# Patient Record
Sex: Female | Born: 1965 | Race: White | Hispanic: No | Marital: Single | Smoking: Never smoker
Health system: Southern US, Community
[De-identification: ages and names within clinical notes are randomized; demographics above are authoritative.]

## PROBLEM LIST (undated history)

## (undated) DIAGNOSIS — M5412 Radiculopathy, cervical region: Secondary | ICD-10-CM

## (undated) DIAGNOSIS — K589 Irritable bowel syndrome without diarrhea: Secondary | ICD-10-CM

## (undated) DIAGNOSIS — Z8489 Family history of other specified conditions: Secondary | ICD-10-CM

## (undated) DIAGNOSIS — F32A Depression, unspecified: Secondary | ICD-10-CM

## (undated) DIAGNOSIS — E669 Obesity, unspecified: Secondary | ICD-10-CM

## (undated) DIAGNOSIS — F319 Bipolar disorder, unspecified: Secondary | ICD-10-CM

## (undated) DIAGNOSIS — M47812 Spondylosis without myelopathy or radiculopathy, cervical region: Secondary | ICD-10-CM

## (undated) DIAGNOSIS — F1411 Cocaine abuse, in remission: Secondary | ICD-10-CM

## (undated) DIAGNOSIS — G8929 Other chronic pain: Secondary | ICD-10-CM

## (undated) DIAGNOSIS — G43909 Migraine, unspecified, not intractable, without status migrainosus: Secondary | ICD-10-CM

## (undated) DIAGNOSIS — T753XXA Motion sickness, initial encounter: Secondary | ICD-10-CM

## (undated) DIAGNOSIS — K219 Gastro-esophageal reflux disease without esophagitis: Secondary | ICD-10-CM

## (undated) DIAGNOSIS — F1010 Alcohol abuse, uncomplicated: Secondary | ICD-10-CM

## (undated) DIAGNOSIS — G4700 Insomnia, unspecified: Secondary | ICD-10-CM

## (undated) DIAGNOSIS — F419 Anxiety disorder, unspecified: Secondary | ICD-10-CM

## (undated) DIAGNOSIS — R0600 Dyspnea, unspecified: Secondary | ICD-10-CM

## (undated) HISTORY — PX: APPENDECTOMY: SHX54

---

## 2002-01-20 ENCOUNTER — Other Ambulatory Visit: Admission: RE | Admit: 2002-01-20 | Discharge: 2002-01-20 | Payer: Self-pay | Admitting: Family Medicine

## 2005-08-22 ENCOUNTER — Ambulatory Visit: Payer: Self-pay | Admitting: Internal Medicine

## 2007-08-31 ENCOUNTER — Emergency Department: Payer: Self-pay | Admitting: Emergency Medicine

## 2007-10-30 ENCOUNTER — Emergency Department: Payer: Self-pay | Admitting: Emergency Medicine

## 2008-02-11 ENCOUNTER — Ambulatory Visit: Payer: Self-pay | Admitting: Unknown Physician Specialty

## 2008-02-19 ENCOUNTER — Ambulatory Visit: Payer: Self-pay | Admitting: Unknown Physician Specialty

## 2009-05-11 ENCOUNTER — Emergency Department: Payer: Self-pay | Admitting: Emergency Medicine

## 2011-03-04 ENCOUNTER — Emergency Department: Payer: Self-pay | Admitting: Unknown Physician Specialty

## 2013-04-23 ENCOUNTER — Emergency Department: Payer: Self-pay | Admitting: Unknown Physician Specialty

## 2013-04-23 LAB — URINALYSIS, COMPLETE
Bilirubin,UR: NEGATIVE
Ph: 5 (ref 4.5–8.0)
Protein: NEGATIVE
Specific Gravity: 1.009 (ref 1.003–1.030)

## 2013-04-23 LAB — COMPREHENSIVE METABOLIC PANEL
Bilirubin,Total: 0.8 mg/dL (ref 0.2–1.0)
Calcium, Total: 9.6 mg/dL (ref 8.5–10.1)
Co2: 23 mmol/L (ref 21–32)
Creatinine: 1.28 mg/dL (ref 0.60–1.30)
Glucose: 136 mg/dL — ABNORMAL HIGH (ref 65–99)
Osmolality: 270 (ref 275–301)
Potassium: 3.6 mmol/L (ref 3.5–5.1)
SGOT(AST): 23 U/L (ref 15–37)
SGPT (ALT): 27 U/L (ref 12–78)
Sodium: 135 mmol/L — ABNORMAL LOW (ref 136–145)
Total Protein: 8.2 g/dL (ref 6.4–8.2)

## 2013-04-23 LAB — DRUG SCREEN, URINE
Barbiturates, Ur Screen: NEGATIVE (ref ?–200)
Cocaine Metabolite,Ur ~~LOC~~: POSITIVE (ref ?–300)
Opiate, Ur Screen: NEGATIVE (ref ?–300)
Phencyclidine (PCP) Ur S: NEGATIVE (ref ?–25)
Tricyclic, Ur Screen: NEGATIVE (ref ?–1000)

## 2013-04-23 LAB — ETHANOL: Ethanol: <3 mg/dL

## 2013-04-23 LAB — CBC
HGB: 16 g/dL (ref 12.0–16.0)
MCH: 32.1 pg (ref 26.0–34.0)
Platelet: 374 10*3/uL (ref 150–440)
RDW: 13.2 % (ref 11.5–14.5)

## 2013-05-31 ENCOUNTER — Encounter (HOSPITAL_BASED_OUTPATIENT_CLINIC_OR_DEPARTMENT_OTHER): Payer: Self-pay | Admitting: *Deleted

## 2013-05-31 ENCOUNTER — Emergency Department (HOSPITAL_BASED_OUTPATIENT_CLINIC_OR_DEPARTMENT_OTHER)
Admission: EM | Admit: 2013-05-31 | Discharge: 2013-05-31 | Disposition: A | Discharge status: Home / Self Care | Payer: Self-pay | Attending: Emergency Medicine | Admitting: Emergency Medicine

## 2013-05-31 DIAGNOSIS — F17200 Nicotine dependence, unspecified, uncomplicated: Secondary | ICD-10-CM | POA: Insufficient documentation

## 2013-05-31 DIAGNOSIS — R0989 Other specified symptoms and signs involving the circulatory and respiratory systems: Secondary | ICD-10-CM | POA: Insufficient documentation

## 2013-05-31 DIAGNOSIS — F329 Major depressive disorder, single episode, unspecified: Secondary | ICD-10-CM

## 2013-05-31 DIAGNOSIS — Z79899 Other long term (current) drug therapy: Secondary | ICD-10-CM | POA: Insufficient documentation

## 2013-05-31 DIAGNOSIS — F3130 Bipolar disorder, current episode depressed, mild or moderate severity, unspecified: Secondary | ICD-10-CM | POA: Insufficient documentation

## 2013-05-31 DIAGNOSIS — R0609 Other forms of dyspnea: Secondary | ICD-10-CM | POA: Insufficient documentation

## 2013-05-31 DIAGNOSIS — IMO0002 Reserved for concepts with insufficient information to code with codable children: Secondary | ICD-10-CM | POA: Insufficient documentation

## 2013-05-31 DIAGNOSIS — G4700 Insomnia, unspecified: Secondary | ICD-10-CM | POA: Insufficient documentation

## 2013-05-31 HISTORY — DX: Insomnia, unspecified: G47.00

## 2013-05-31 HISTORY — DX: Bipolar disorder, unspecified: F31.9

## 2013-05-31 MED ORDER — TRAZODONE HCL 100 MG PO TABS: 100.0000 mg | ORAL_TABLET | Freq: Every day | ORAL | Status: DC

## 2013-05-31 NOTE — ED Notes (Signed)
Pt reports uncontrollable crying, has not slept in two days, feels "more depressed", headaches, and nausea over the past 5 days. Pt reports recent addition of abilify medication.

## 2013-05-31 NOTE — ED Provider Notes (Signed)
  CSN: [id number]     Arrival date & time 05/31/13  1535 History     None    Chief Complaint  Patient presents with  . Depression   (Consider location/radiation/quality/duration/timing/severity/associated sxs/prior Treatment) Patient is a 47 y.o. female presenting with mental health disorder. The history is provided by the patient. No language interpreter was used.  Mental Health Problem Presenting symptoms: agitation and depression   Degree of incapacity (severity):  Unable to specify Duration:  4 days Timing:  Constant Progression:  Worsening Relieved by:  Nothing Worsened by:  Alcohol Ineffective treatments:  None tried Associated symptoms: insomnia   Pt reports she is at [facility] for alcohol abuse.  Pt reports she was started on abilify 4 days ago.   Pt reports increased depressson  Since starting abilify.   Pt reports not sleeping  Past Medical History  Diagnosis Date  . Bipolar 1 disorder, depressed   . Insomnia    History reviewed. No pertinent past surgical history. No family history on file. History  Substance Use Topics  . Smoking status: Current Every Day Smoker  . Smokeless tobacco: Not on file  . Alcohol Use: Yes   OB History   Grav Para Term Preterm Abortions TAB SAB Ect Mult Living                 Review of Systems  Psychiatric/Behavioral: Positive for agitation. The patient has insomnia.   All other systems reviewed and are negative.    Allergies  Review of patient's allergies indicates no known allergies.  Home Medications   Current Outpatient Rx  Name  Route  Sig  Dispense  Refill  . ARIPiprazole (ABILIFY) 5 MG tablet   Oral   Take 5 mg by mouth daily.         Marland Kitchen buPROPion (WELLBUTRIN SR) 150 MG 12 hr tablet   Oral   Take 150 mg by mouth 2 (two) times daily.         . traZODone (DESYREL) 50 MG tablet   Oral   Take 50 mg by mouth at bedtime.          BP 114/95  Pulse 83  Temp(Src) 98.6 F (37 C) (Oral)  Resp 18  Ht 5\' 6"   (1.676 m)  Wt 176 lb (79.833 kg)  BMI 28.42 kg/m2  SpO2 100% Physical Exam  Nursing note and vitals reviewed. Constitutional: She is oriented to person, place, and time. She appears well-developed and well-nourished.  HENT:  Head: Normocephalic.  Right Ear: External ear normal.  Left Ear: External ear normal.  Eyes: EOM are normal. Pupils are equal, round, and reactive to light.  Neck: Normal range of motion.  Cardiovascular: Normal rate and regular rhythm.   Pulmonary/Chest: She is in respiratory distress.  Abdominal: Soft. She exhibits no distension.  Musculoskeletal: Normal range of motion.  Neurological: She is alert and oriented to person, place, and time.  Skin: Skin is warm.  Psychiatric: She has a normal mood and affect.    ED Course   Procedures (including critical care time)  Labs Reviewed - No data to display No results found. 1. Depression     MDM  Stop wellbitrin,   Increase trazadone to 200mg  at bedtime.   Stop abilify  Elson Areas, [place] 05/31/13 1648

## 2013-05-31 NOTE — ED Notes (Signed)
Called [facility] and updated on patient.  [facility] on the way to pick up patient.

## 2013-06-01 NOTE — ED Provider Notes (Signed)
Medical screening examination/treatment/procedure(s) were performed by non-physician practitioner and as supervising physician I was immediately available for consultation/collaboration.  Gerhard Munch, MD 06/01/13 [phone]

## 2013-06-05 ENCOUNTER — Emergency Department (HOSPITAL_BASED_OUTPATIENT_CLINIC_OR_DEPARTMENT_OTHER)
Admission: EM | Admit: 2013-06-05 | Discharge: 2013-06-05 | Disposition: A | Discharge status: Rehabilitation Facility | Payer: Self-pay | Attending: Emergency Medicine | Admitting: Emergency Medicine

## 2013-06-05 ENCOUNTER — Encounter (HOSPITAL_BASED_OUTPATIENT_CLINIC_OR_DEPARTMENT_OTHER): Payer: Self-pay

## 2013-06-05 ENCOUNTER — Emergency Department (HOSPITAL_BASED_OUTPATIENT_CLINIC_OR_DEPARTMENT_OTHER): Payer: Self-pay

## 2013-06-05 DIAGNOSIS — R110 Nausea: Secondary | ICD-10-CM | POA: Insufficient documentation

## 2013-06-05 DIAGNOSIS — F17200 Nicotine dependence, unspecified, uncomplicated: Secondary | ICD-10-CM | POA: Insufficient documentation

## 2013-06-05 DIAGNOSIS — Z79899 Other long term (current) drug therapy: Secondary | ICD-10-CM | POA: Insufficient documentation

## 2013-06-05 DIAGNOSIS — K5900 Constipation, unspecified: Secondary | ICD-10-CM | POA: Insufficient documentation

## 2013-06-05 DIAGNOSIS — F3130 Bipolar disorder, current episode depressed, mild or moderate severity, unspecified: Secondary | ICD-10-CM | POA: Insufficient documentation

## 2013-06-05 DIAGNOSIS — G4700 Insomnia, unspecified: Secondary | ICD-10-CM | POA: Insufficient documentation

## 2013-06-05 LAB — CBC WITH DIFFERENTIAL/PLATELET
Eosinophils Absolute: 0.2 10*3/uL (ref 0.0–0.7)
Eosinophils Relative: 3 % (ref 0–5)
HCT: 38.8 % (ref 36.0–46.0)
Hemoglobin: 13.5 g/dL (ref 12.0–15.0)
Lymphs Abs: 2.1 10*3/uL (ref 0.7–4.0)
MCH: 31.6 pg (ref 26.0–34.0)
MCV: 90.9 fL (ref 78.0–100.0)
Monocytes Absolute: 0.6 10*3/uL (ref 0.1–1.0)
Monocytes Relative: 8 % (ref 3–12)
RBC: 4.27 MIL/uL (ref 3.87–5.11)

## 2013-06-05 LAB — COMPREHENSIVE METABOLIC PANEL
ALT: 16 U/L (ref 0–35)
Alkaline Phosphatase: 49 U/L (ref 39–117)
BUN: 13 mg/dL (ref 6–23)
CO2: 28 mEq/L (ref 19–32)
Calcium: 9.6 mg/dL (ref 8.4–10.5)
GFR calc Af Amer: 86 mL/min — ABNORMAL LOW (ref >90)
GFR calc non Af Amer: 74 mL/min — ABNORMAL LOW (ref >90)
Glucose, Bld: 116 mg/dL — ABNORMAL HIGH (ref 70–99)
Sodium: 138 mEq/L (ref 135–145)

## 2013-06-05 LAB — URINALYSIS, ROUTINE W REFLEX MICROSCOPIC
Bilirubin Urine: NEGATIVE
Glucose, UA: NEGATIVE mg/dL
Hgb urine dipstick: NEGATIVE
Ketones, ur: NEGATIVE mg/dL
Protein, ur: NEGATIVE mg/dL

## 2013-06-05 MED ORDER — SODIUM CHLORIDE 0.9 % IV BOLUS (SEPSIS)
1000.0000 mL | Freq: Once | INTRAVENOUS | Status: AC
  Administered 2013-06-05: 1000 mL via INTRAVENOUS

## 2013-06-05 MED ORDER — ONDANSETRON HCL 4 MG/2ML IJ SOLN
4.0000 mg | Freq: Once | INTRAMUSCULAR | Status: AC
  Administered 2013-06-05: 4 mg via INTRAVENOUS
  Filled 2013-06-05: qty 2

## 2013-06-05 MED ORDER — ONDANSETRON HCL 4 MG PO TABS: 4.0000 mg | ORAL_TABLET | Freq: Three times a day (TID) | ORAL | Status: DC | PRN

## 2013-06-05 MED ORDER — POLYETHYLENE GLYCOL 3350 17 GM/SCOOP PO POWD: 17.0000 g | Freq: Every day | ORAL | Status: DC

## 2013-06-05 NOTE — ED Notes (Signed)
Pt returned from xray

## 2013-06-05 NOTE — ED Notes (Signed)
Patient transported to X-ray 

## 2013-06-05 NOTE — ED Provider Notes (Signed)
CSN: [id number]     Arrival date & time 06/05/13  0715 History     First MD Initiated Contact with Patient 06/05/13 [phone]     Chief Complaint  Patient presents with  . Abdominal Pain   (Consider location/radiation/quality/duration/timing/severity/associated sxs/prior Treatment) Patient is a 47 y.o. female presenting with abdominal pain.  Abdominal Pain  Pt is a resident at [facility] for approx 25 days treatment for EtOH abuse. Recently seen in the ED 4 days ago for depression and insomnia after starting Abilify. She was advised to increase Trazadone and stop Abilify. Since then she reports persistent nausea, poor PO intake no BM and 'tight' abdominal discomfort in epigastric area. No dysuria no vomiting.  Past Medical History  Diagnosis Date  . Bipolar 1 disorder, depressed   . Insomnia    No past surgical history on file. No family history on file. History  Substance Use Topics  . Smoking status: Current Every Day Smoker  . Smokeless tobacco: Not on file  . Alcohol Use: Yes   OB History   Grav Para Term Preterm Abortions TAB SAB Ect Mult Living                 Review of Systems  Gastrointestinal: Positive for abdominal pain.   All other systems reviewed and are negative except as noted in HPI.   Allergies  Review of patient's allergies indicates no known allergies.  Home Medications   Current Outpatient Rx  Name  Route  Sig  Dispense  Refill  . ARIPiprazole (ABILIFY) 5 MG tablet   Oral   Take 5 mg by mouth daily.         Marland Kitchen buPROPion (WELLBUTRIN SR) 150 MG 12 hr tablet   Oral   Take 150 mg by mouth 2 (two) times daily.         . traZODone (DESYREL) 100 MG tablet   Oral   Take 1 tablet (100 mg total) by mouth at bedtime.   30 tablet   1   . traZODone (DESYREL) 50 MG tablet   Oral   Take 50 mg by mouth at bedtime.          BP 106/72  Pulse 72  Temp(Src) 97.8 F (36.6 C) (Oral)  Resp 18  SpO2 100%  LMP 05/05/2013 Physical Exam  Nursing note and  vitals reviewed. Constitutional: She is oriented to person, place, and time. She appears well-developed and well-nourished.  HENT:  Head: Normocephalic and atraumatic.  Eyes: EOM are normal. Pupils are equal, round, and reactive to light.  Neck: Normal range of motion. Neck supple.  Cardiovascular: Normal rate, normal heart sounds and intact distal pulses.   Pulmonary/Chest: Effort normal and breath sounds normal. She has no wheezes. She has no rales.  Abdominal: Bowel sounds are normal. She exhibits no distension. There is no tenderness. There is no rebound and no guarding.  Musculoskeletal: Normal range of motion. She exhibits no edema and no tenderness.  Neurological: She is alert and oriented to person, place, and time. She has normal strength. No cranial nerve deficit or sensory deficit.  Skin: Skin is warm and dry. No rash noted.  Psychiatric: She has a normal mood and affect.    ED Course   Procedures (including critical care time)  Labs Reviewed  URINALYSIS, ROUTINE W REFLEX MICROSCOPIC - Abnormal; Notable for the following:    APPearance CLOUDY (*)    All other components within normal limits  COMPREHENSIVE METABOLIC PANEL - Abnormal; Notable  for the following:    Glucose, Bld 116 (*)    GFR calc non Af Amer 74 (*)    GFR calc Af Amer 86 (*)    All other components within normal limits  CBC WITH DIFFERENTIAL  LIPASE, BLOOD   Dg Abd Acute W/chest  06/05/2013   *RADIOLOGY REPORT*  Clinical Data: Nausea.  Abdominal pain.  ACUTE ABDOMEN SERIES (ABDOMEN 2 VIEW & CHEST 1 VIEW)  Comparison: None.  Findings: Lungs appear clear.  Cardiopericardial silhouette within normal limits.  No free air underneath the hemidiaphragms.  Bowel gas pattern is within normal limits.  No dilated loops of large or small bowel.  Stool and bowel gas are present in the rectosigmoid. Bones appear within normal limits.  IMPRESSION: No acute abnormality.  Normal acute abdominal series.   Original Report  Authenticated By: Andreas Newport, M.D.   No diagnosis found.  MDM  Labs and imaging results reviewed. Pt feeling better. Advised to discuss psychotropic medication changes with the provider who is prescribing them. Advised zofran for nausea, stool softeners for constipation.   Lilianne Delair B. Bernette Mayers, MD 06/05/13 1610

## 2013-06-05 NOTE — ED Notes (Signed)
C/o abdominal pain and tightness in her upper abdomen.  Pt states that this started around a week ago at the time that she started Wellbutrin and her Trazodone dose changed.  She has not had a BM in the 4-5 days, usually has one every other day, and has felt nauseous.  Pt denies vomiting or diarrhea.  Pt in NAD, AAOx4.

## 2013-07-03 ENCOUNTER — Emergency Department: Payer: Self-pay | Admitting: Emergency Medicine

## 2014-05-21 ENCOUNTER — Emergency Department: Payer: Self-pay | Admitting: Emergency Medicine

## 2014-11-30 ENCOUNTER — Emergency Department: Payer: Self-pay | Admitting: Emergency Medicine

## 2015-01-31 ENCOUNTER — Emergency Department
Admit: 2015-01-31 | Disposition: A | Discharge status: Home / Self Care | Payer: Self-pay | Admitting: Emergency Medicine

## 2015-06-08 ENCOUNTER — Encounter: Payer: Self-pay | Admitting: Emergency Medicine

## 2015-06-08 ENCOUNTER — Emergency Department
Admission: EM | Admit: 2015-06-08 | Discharge: 2015-06-08 | Disposition: A | Discharge status: Home / Self Care | Payer: Self-pay | Attending: Emergency Medicine | Admitting: Emergency Medicine

## 2015-06-08 DIAGNOSIS — K029 Dental caries, unspecified: Secondary | ICD-10-CM | POA: Insufficient documentation

## 2015-06-08 DIAGNOSIS — Z79899 Other long term (current) drug therapy: Secondary | ICD-10-CM | POA: Insufficient documentation

## 2015-06-08 MED ORDER — KETOROLAC TROMETHAMINE 10 MG PO TABS
10.0000 mg | ORAL_TABLET | Freq: Once | ORAL | Status: AC
  Administered 2015-06-08: 10 mg via ORAL
  Filled 2015-06-08: qty 1

## 2015-06-08 MED ORDER — KETOROLAC TROMETHAMINE 10 MG PO TABS: 10.0000 mg | ORAL_TABLET | Freq: Three times a day (TID) | ORAL | Status: DC | PRN

## 2015-06-08 MED ORDER — AMOXICILLIN 500 MG PO CAPS
500.0000 mg | ORAL_CAPSULE | Freq: Once | ORAL | Status: AC
  Administered 2015-06-08: 500 mg via ORAL
  Filled 2015-06-08: qty 1

## 2015-06-08 MED ORDER — LIDOCAINE VISCOUS 2 % MT SOLN
15.0000 mL | Freq: Once | OROMUCOSAL | Status: AC
  Administered 2015-06-08: 15 mL via OROMUCOSAL
  Filled 2015-06-08: qty 15

## 2015-06-08 MED ORDER — AMOXICILLIN 500 MG PO CAPS: 500.0000 mg | ORAL_CAPSULE | Freq: Two times a day (BID) | ORAL | Status: AC

## 2015-06-08 NOTE — ED Notes (Signed)
Patient ambulatory to triage with steady gait, without difficulty or distress noted; pt reports right lower dental pain

## 2015-06-08 NOTE — Discharge Instructions (Signed)
Dental Caries °Dental caries (also called tooth decay) is the most common oral disease. It can occur at any age but is more common in children and young adults.  °HOW DENTAL CARIES DEVELOPS  °The process of decay begins when bacteria and foods (particularly sugars and starches) combine in your mouth to produce plaque. Plaque is a substance that sticks to the hard, outer surface of a tooth (enamel). The bacteria in plaque produce acids that attack enamel. These acids may also attack the root surface of a tooth (cementum) if it is exposed. Repeated attacks dissolve these surfaces and create holes in the tooth (cavities). If left untreated, the acids destroy the other layers of the tooth.  °RISK FACTORS °· Frequent sipping of sugary beverages.   °· Frequent snacking on sugary and starchy foods, especially those that easily get stuck in the teeth.   °· Poor oral hygiene.   °· Dry mouth.   °· Substance abuse such as methamphetamine abuse.   °· Broken or poor-fitting dental restorations.   °· Eating disorders.   °· Gastroesophageal reflux disease (GERD).   °· Certain radiation treatments to the head and neck. °SYMPTOMS °In the early stages of dental caries, symptoms are seldom present. Sometimes white, chalky areas may be seen on the enamel or other tooth layers. In later stages, symptoms may include: °· Pits and holes on the enamel. °· Toothache after sweet, hot, or cold foods or drinks are consumed. °· Pain around the tooth. °· Swelling around the tooth. °DIAGNOSIS  °Most of the time, dental caries is detected during a regular dental checkup. A diagnosis is made after a thorough medical and dental history is taken and the surfaces of your teeth are checked for signs of dental caries. Sometimes special instruments, such as lasers, are used to check for dental caries. Dental X-ray exams may be taken so that areas not visible to the eye (such as between the contact areas of the teeth) can be checked for cavities.    °TREATMENT  °If dental caries is in its early stages, it may be reversed with a fluoride treatment or an application of a remineralizing agent at the dental office. Thorough brushing and flossing at home is needed to aid these treatments. If it is in its later stages, treatment depends on the location and extent of tooth destruction:  °· If a small area of the tooth has been destroyed, the destroyed area will be removed and cavities will be filled with a material such as gold, silver amalgam, or composite resin.   °· If a large area of the tooth has been destroyed, the destroyed area will be removed and a cap (crown) will be fitted over the remaining tooth structure.   °· If the center part of the tooth (pulp) is affected, a procedure called a root canal will be needed before a filling or crown can be placed.   °· If most of the tooth has been destroyed, the tooth may need to be pulled (extracted). °HOME CARE INSTRUCTIONS °You can prevent, stop, or reverse dental caries at home by practicing good oral hygiene. Good oral hygiene includes: °· Thoroughly cleaning your teeth at least twice a day with a toothbrush and dental floss.   °· Using a fluoride toothpaste. A fluoride mouth rinse may also be used if recommended by your dentist or health care provider.   °· Restricting the amount of sugary and starchy foods and sugary liquids you consume.   °· Avoiding frequent snacking on these foods and sipping of these liquids.   °· Keeping regular visits with   the amount of sugary and starchy foods and sugary liquids you consume.    Avoiding frequent snacking on these foods and sipping of these liquids.    Keeping regular visits with a dentist for checkups and cleanings.  PREVENTION    Practice good oral hygiene.   Consider a dental sealant. A dental sealant is a coating material that is applied by your dentist to the pits and grooves of teeth. The sealant prevents food from being trapped in them. It may protect the teeth for several years.   Ask about fluoride supplements if you live in a community without fluorinated water or with water that has a low fluoride content. Use fluoride supplements  as directed by your dentist or health care provider.   Allow fluoride varnish applications to teeth if directed by your dentist or health care provider.  Document Released: 07/07/2002 Document Revised: 03/01/2014 Document Reviewed: 10/17/2012  Exit[employer] Patient Information 2015 [employer]. This information is not intended to replace advice given to you by your health care provider. Make sure you discuss any questions you have with your health care provider.

## 2015-06-08 NOTE — ED Provider Notes (Signed)
[facility] Emergency Department Provider Note  ____________________________________________  Time seen: 6:30 AM  I have reviewed the triage vital signs and the nursing notes.   HISTORY  Chief Complaint Dental Pain      HPI Sheyla Shannell Mikkelsen is a 49 y.o. female presents with right mandible molar pain 2 days. Patient denies any fever no difficulty swallowing no neck pain or swelling.     Past Medical History  Diagnosis Date  . Bipolar 1 disorder, depressed   . Insomnia     There are no active problems to display for this patient.  Past surgical history None  Current Outpatient Rx  Name  Route  Sig  Dispense  Refill  . ARIPiprazole (ABILIFY) 5 MG tablet   Oral   Take 5 mg by mouth daily.         Marland Kitchen buPROPion (WELLBUTRIN SR) 150 MG 12 hr tablet   Oral   Take 150 mg by mouth 2 (two) times daily.         . ondansetron (ZOFRAN) 4 MG tablet   Oral   Take 1 tablet (4 mg total) by mouth every 8 (eight) hours as needed for nausea.   20 tablet   0   . polyethylene glycol powder (GLYCOLAX/MIRALAX) powder   Oral   Take 17 g by mouth daily.   255 g   0   . traZODone (DESYREL) 100 MG tablet   Oral   Take 1 tablet (100 mg total) by mouth at bedtime.   30 tablet   1   . traZODone (DESYREL) 50 MG tablet   Oral   Take 50 mg by mouth at bedtime.           Allergies Review of patient's allergies indicates no known allergies.  No family history on file.  Social History Social History  Substance Use Topics  . Smoking status: Never Smoker   . Smokeless tobacco: None  . Alcohol Use: No    Review of Systems  Constitutional: Negative for fever. Eyes: Negative for visual changes. ENT: Negative for sore throat. Positive for dental pain Cardiovascular: Negative for chest pain. Respiratory: Negative for shortness of breath. Gastrointestinal: Negative for abdominal pain, vomiting and diarrhea. Genitourinary: Negative for  dysuria. Musculoskeletal: Negative for back pain. Skin: Negative for rash. Neurological: Negative for headaches, focal weakness or numbness.   10-point ROS otherwise negative.  ____________________________________________   PHYSICAL EXAM:  VITAL SIGNS: ED Triage Vitals  Enc Vitals Group     BP 06/08/15 0612 158/93 mmHg     Pulse Rate 06/08/15 0612 81     Resp 06/08/15 0612 18     Temp 06/08/15 0612 97.9 F (36.6 C)     Temp Source 06/08/15 0612 Oral     SpO2 06/08/15 0612 98 %     Weight 06/08/15 0612 205 lb (92.987 kg)     Height 06/08/15 0612 5\' 6"  (1.676 m)     Head Cir --      Peak Flow --      Pain Score 06/08/15 0613 10     Pain Loc --      Pain Edu? --      Excl. in [place]? --      Constitutional: Alert and oriented. Well appearing and in no distress. Eyes: Conjunctivae are normal. PERRL. Normal extraocular movements. ENT   Head: Normocephalic and atraumatic.   Nose: No congestion/rhinnorhea.   Mouth/Throat: Mucous membranes are moist. Right mandible molar dental  carry noted with surrounding gum inflammation.   Neck: No stridor. Cardiovascular: Normal rate, regular rhythm. Normal and symmetric distal pulses are present in all extremities. No murmurs, rubs, or gallops. Respiratory: Normal respiratory effort without tachypnea nor retractions. Breath sounds are clear and equal bilaterally. No wheezes/rales/rhonchi. Gastrointestinal: Soft and nontender. No distention. There is no CVA tenderness. Genitourinary: deferred Musculoskeletal: Nontender with normal range of motion in all extremities. No joint effusions.  No lower extremity tenderness nor edema. Neurologic:  Normal speech and language. No gross focal neurologic deficits are appreciated. Speech is normal.  Skin:  Skin is warm, dry and intact. No rash noted. Psychiatric: Mood and affect are normal. Speech and behavior are normal. Patient exhibits appropriate insight and judgment.      INITIAL  IMPRESSION / ASSESSMENT AND PLAN / ED COURSE  Pertinent labs & imaging results that were available during my care of the patient were reviewed by me and considered in my medical decision making (see chart for details).    ____________________________________________   FINAL CLINICAL IMPRESSION(S) / ED DIAGNOSES  Final diagnoses:  Dental caries      Gregor Hams, MD 06/08/15 [phone]

## 2016-01-19 ENCOUNTER — Encounter: Payer: Self-pay | Admitting: Emergency Medicine

## 2016-01-19 ENCOUNTER — Emergency Department: Payer: Self-pay

## 2016-01-19 ENCOUNTER — Emergency Department
Admission: EM | Admit: 2016-01-19 | Discharge: 2016-01-19 | Disposition: A | Discharge status: Home / Self Care | Payer: Self-pay | Attending: Emergency Medicine | Admitting: Emergency Medicine

## 2016-01-19 DIAGNOSIS — N939 Abnormal uterine and vaginal bleeding, unspecified: Secondary | ICD-10-CM

## 2016-01-19 DIAGNOSIS — R102 Pelvic and perineal pain: Secondary | ICD-10-CM

## 2016-01-19 DIAGNOSIS — D259 Leiomyoma of uterus, unspecified: Secondary | ICD-10-CM | POA: Insufficient documentation

## 2016-01-19 DIAGNOSIS — Z3202 Encounter for pregnancy test, result negative: Secondary | ICD-10-CM | POA: Insufficient documentation

## 2016-01-19 LAB — CBC
HEMATOCRIT: 37.9 % (ref 35.0–47.0)
Hemoglobin: 13 g/dL (ref 12.0–16.0)
MCH: 30 pg (ref 26.0–34.0)
MCHC: 34.2 g/dL (ref 32.0–36.0)
MCV: 87.7 fL (ref 80.0–100.0)
Platelets: 303 10*3/uL (ref 150–440)
RBC: 4.32 MIL/uL (ref 3.80–5.20)
RDW: 14.2 % (ref 11.5–14.5)
WBC: 5.9 10*3/uL (ref 3.6–11.0)

## 2016-01-19 LAB — URINALYSIS COMPLETE WITH MICROSCOPIC (ARMC ONLY)
BACTERIA UA: NONE SEEN
Bilirubin Urine: NEGATIVE
GLUCOSE, UA: NEGATIVE mg/dL
Ketones, ur: NEGATIVE mg/dL
Leukocytes, UA: NEGATIVE
Nitrite: NEGATIVE
PROTEIN: NEGATIVE mg/dL
Specific Gravity, Urine: 1.023 (ref 1.005–1.030)
pH: 5 (ref 5.0–8.0)

## 2016-01-19 LAB — BASIC METABOLIC PANEL
Anion gap: 3 — ABNORMAL LOW (ref 5–15)
BUN: 9 mg/dL (ref 6–20)
CALCIUM: 8.5 mg/dL — AB (ref 8.9–10.3)
CHLORIDE: 104 mmol/L (ref 101–111)
CO2: 29 mmol/L (ref 22–32)
CREATININE: 0.81 mg/dL (ref 0.44–1.00)
GFR calc non Af Amer: >60 mL/min (ref >60)
GLUCOSE: 95 mg/dL (ref 65–99)
Potassium: 3.3 mmol/L — ABNORMAL LOW (ref 3.5–5.1)
Sodium: 136 mmol/L (ref 135–145)

## 2016-01-19 LAB — POCT PREGNANCY, URINE: PREG TEST UR: NEGATIVE

## 2016-01-19 MED ORDER — OXYCODONE-ACETAMINOPHEN 5-325 MG PO TABS: 2.0000 | ORAL_TABLET | Freq: Four times a day (QID) | ORAL | Status: DC | PRN

## 2016-01-19 MED ORDER — OXYCODONE-ACETAMINOPHEN 5-325 MG PO TABS: 1.0000 | ORAL_TABLET | Freq: Once | ORAL | Status: AC

## 2016-01-19 MED ORDER — OXYCODONE-ACETAMINOPHEN 5-325 MG PO TABS
1.0000 | ORAL_TABLET | Freq: Once | ORAL | Status: AC
  Administered 2016-01-19: 1 via ORAL

## 2016-01-19 MED ORDER — OXYCODONE-ACETAMINOPHEN 5-325 MG PO TABS
ORAL_TABLET | ORAL | Status: AC
  Administered 2016-01-19: 1 via ORAL
  Filled 2016-01-19: qty 1

## 2016-01-19 MED ORDER — OXYCODONE-ACETAMINOPHEN 5-325 MG PO TABS
1.0000 | ORAL_TABLET | Freq: Once | ORAL | Status: AC
  Administered 2016-01-19: 1 via ORAL
  Filled 2016-01-19: qty 1

## 2016-01-19 MED ORDER — OXYCODONE-ACETAMINOPHEN 5-325 MG PO TABS
ORAL_TABLET | ORAL | Status: AC
  Filled 2016-01-19: qty 1

## 2016-01-19 MED ORDER — IBUPROFEN 800 MG PO TABS: 800.0000 mg | ORAL_TABLET | Freq: Three times a day (TID) | ORAL | Status: DC | PRN

## 2016-01-19 NOTE — ED Notes (Signed)
Patient to ED today with vaginal bleeding and lower abdominal cramping that has been severe since January.  Pt has history of vaginal bleeding and also bladder sling.  Pt also states she is now incontinent of urine again, despite sling.

## 2016-01-19 NOTE — ED Notes (Addendum)
Patient ambulatory to triage with steady gait, without difficulty or distress noted; pt reports vag bleeding for last few months; seen by doctor and dx possible fibroid tumors; sched 4/10 for biopsy & u/s but having increased bleeding and lower abd cramping

## 2016-01-19 NOTE — ED Provider Notes (Signed)
[facility] Emergency Department Provider Note     Time seen: ----------------------------------------- 7:31 AM on 01/19/2016 -----------------------------------------    I have reviewed the triage vital signs and the nursing notes.   HISTORY  Chief Complaint Vaginal Bleeding    HPI Stephanie Holland is a 50 y.o. female who presents ER for vaginal bleeding for last several months. Patient states she did not have a period for 6 months and then began having heavy bleeding in January and then again over the past several months. She was seen by her doctor and diagnosed with fibroids, has scheduled biopsy on April 10 but was having increasing bleeding and lower abdominal cramping. She denies fevers chills or other complaints. Nothing makes her symptoms better.   Past Medical History  Diagnosis Date  . Bipolar 1 disorder, depressed ([place])   . Insomnia     There are no active problems to display for this patient.   Past Surgical History  Procedure Laterality Date  . Appendectomy      Allergies Review of patient's allergies indicates no known allergies.  Social History Social History  Substance Use Topics  . Smoking status: Never Smoker   . Smokeless tobacco: None  . Alcohol Use: No    Review of Systems Constitutional: Negative for fever. Eyes: Negative for visual changes. ENT: Negative for sore throat. Cardiovascular: Negative for chest pain. Respiratory: Negative for shortness of breath. Gastrointestinal: Positive for abdominal pain Genitourinary: Negative for dysuria. Positive for vaginal bleeding Musculoskeletal: Negative for back pain. Skin: Negative for rash. Neurological: Negative for headaches, focal weakness or numbness.  10-point ROS otherwise negative.  ____________________________________________   PHYSICAL EXAM:  VITAL SIGNS: ED Triage Vitals  Enc Vitals Group     BP 01/19/16 0319 149/86 mmHg     Pulse Rate 01/19/16 0319  76     Resp 01/19/16 0319 18     Temp 01/19/16 0319 98 F (36.7 C)     Temp Source 01/19/16 0319 Oral     SpO2 01/19/16 0319 98 %     Weight 01/19/16 0319 198 lb (89.812 kg)     Height 01/19/16 0319 5\' 7"  (1.702 m)     Head Cir --      Peak Flow --      Pain Score 01/19/16 0317 10     Pain Loc --      Pain Edu? --      Excl. in [place]? --     Constitutional: Alert and oriented. Well appearing and in no distress. Eyes: Conjunctivae are normal. PERRL. Normal extraocular movements. ENT   Head: Normocephalic and atraumatic.   Nose: No congestion/rhinnorhea.   Mouth/Throat: Mucous membranes are moist.   Neck: No stridor. Cardiovascular: Normal rate, regular rhythm. Normal and symmetric distal pulses are present in all extremities. No murmurs, rubs, or gallops. Respiratory: Normal respiratory effort without tachypnea nor retractions. Breath sounds are clear and equal bilaterally. No wheezes/rales/rhonchi. Gastrointestinal: Lower abdominal tenderness, no rebound or guarding. Normal bowel sounds. Musculoskeletal: Nontender with normal range of motion in all extremities. No joint effusions.  No lower extremity tenderness nor edema. Neurologic:  Normal speech and language. No gross focal neurologic deficits are appreciated. Speech is normal. No gait instability. Skin:  Skin is warm, dry and intact. No rash noted. Psychiatric: Mood and affect are normal. Speech and behavior are normal. Patient exhibits appropriate insight and judgment. ____________________________________________  ED COURSE:  Pertinent labs & imaging results that were available during my care of  the patient were reviewed by me and considered in my medical decision making (see chart for details). Patient is no acute distress, will check basic labs and likely ultrasound. ____________________________________________    LABS (pertinent positives/negatives)  Labs Reviewed  BASIC METABOLIC PANEL - Abnormal; Notable  for the following:    Potassium 3.3 (*)    Calcium 8.5 (*)    Anion gap 3 (*)    All other components within normal limits  URINALYSIS COMPLETEWITH MICROSCOPIC ([facility] ONLY) - Abnormal; Notable for the following:    Color, Urine YELLOW (*)    APPearance CLEAR (*)    Hgb urine dipstick 3+ (*)    Squamous Epithelial / LPF 0-5 (*)    All other components within normal limits  CBC  POCT PREGNANCY, URINE    RADIOLOGY US  IMPRESSION: Multiple fibroids in the uterus, including a posterior submucosal fibroid. Mild thickening in the endometrium with endometrial calcifications. No endometrial fluid. Ovaries are normal without evidence of abnormal adnexal mass or torsion.   ____________________________________________  FINAL ASSESSMENT AND PLAN  Fibroid uterus, abnormal vaginal bleeding  Plan: Patient with labs and imaging as dictated above. Patient does have outpatient follow-up scheduled. She is in no acute distress, feeling better with Percocet. Patient does not want to try hormone therapy at this time due to the risk associated. I will advise her to continue follow-up with her doctor.   Earleen Newport, MD   Earleen Newport, MD 01/19/16 [phone]

## 2016-01-19 NOTE — ED Notes (Signed)
St pain has decreased to 4/10

## 2016-01-19 NOTE — ED Notes (Signed)
Pt to triage, updated on wait time; st pain has returned 10/10; med admin for comfort

## 2016-01-19 NOTE — Discharge Instructions (Signed)
Abnormal Uterine Bleeding °Abnormal uterine bleeding can affect women at various stages in life, including teenagers, women in their reproductive years, pregnant women, and women who have reached menopause. Several kinds of uterine bleeding are considered abnormal, including: °· Bleeding or spotting between periods.   °· Bleeding after sexual intercourse.   °· Bleeding that is heavier or more than normal.   °· Periods that last longer than usual. °· Bleeding after menopause.   °Many cases of abnormal uterine bleeding are minor and simple to treat, while others are more serious. Any type of abnormal bleeding should be evaluated by your health care provider. Treatment will depend on the cause of the bleeding. °HOME CARE INSTRUCTIONS °Monitor your condition for any changes. The following actions may help to alleviate any discomfort you are experiencing: °· Avoid the use of tampons and douches as directed by your health care provider. °· Change your pads frequently. °You should get regular pelvic exams and Pap tests. Keep all follow-up appointments for diagnostic tests as directed by your health care provider.  °SEEK MEDICAL CARE IF:  °· Your bleeding lasts more than 1 week.   °· You feel dizzy at times.   °SEEK IMMEDIATE MEDICAL CARE IF:  °· You pass out.   °· You are changing pads every 15 to 30 minutes.   °· You have abdominal pain. °· You have a fever.   °· You become sweaty or weak.   °· You are passing large blood clots from the vagina.   °· You start to feel nauseous and vomit. °MAKE SURE YOU:  °· Understand these instructions. °· Will watch your condition. °· Will get help right away if you are not doing well or get worse. °  °This information is not intended to replace advice given to you by your health care provider. Make sure you discuss any questions you have with your health care provider. °  °Document Released: 10/15/2005 Document Revised: 10/20/2013 Document Reviewed: 05/14/2013 °Elsevier Interactive  Patient Education ©2016 Else[employer] Inc[employer] ° ° ° °Uterine Fibroids °Uterine fibroids are tissue masses (tumors) that can develop in the womb (uterus). They are also called leiomyomas. This type of tumor is not cancerous (benign) and does not spread to other parts of the body outside of the pelvic area, which is between the hip bones. Occasionally, fibroids may develop in the fallopian tubes, in the cervix, or on the support structures (ligaments) that surround the uterus. °You can have one or many fibroids. Fibroids can vary in size, weight, and where they grow in the uterus. Some can become quite large. Most fibroids do not require medical treatment. °CAUSES °A fibroid can develop when a single uterine cell keeps growing (replicating). Most cells in the human body have a control mechanism that keeps them from replicating without control. °SIGNS AND SYMPTOMS °Symptoms may include:  °· Heavy bleeding during your period. °· Bleeding or spotting between periods. °· Pelvic pain and pressure. °· Bladder problems, such as needing to urinate more often (urinary frequency) or urgently. °· Inability to reproduce offspring (infertility). °· Miscarriages. °DIAGNOSIS °Uterine fibroids are diagnosed through a physical exam. Your health care provider may feel the lumpy tumors during a pelvic exam. Ultrasonography and an MRI may be done to determine the size, location, and number of fibroids. °TREATMENT °Treatment may include: °· Watchful waiting. This involves getting the fibroid checked by your health care provider to see if it grows or shrinks. Follow your health care provider's recommendations for how often to have this checked. °· Hormone medicines. These can be taken by   mouth or given through an intrauterine device (IUD). °· Surgery. °¨ Removing the fibroids (myomectomy) or the uterus (hysterectomy). °¨ Removing blood supply to the fibroids (uterine artery embolization). °If fibroids interfere with your fertility and you want to  become pregnant, your health care provider may recommend having the fibroids removed.  °HOME CARE INSTRUCTIONS °· Keep all follow-up visits as directed by your health care provider. This is important. °· Take medicines only as directed by your health care provider. °¨ If you were prescribed a hormone treatment, take the hormone medicines exactly as directed. °¨ Do not take aspirin, because it can cause bleeding. °· Ask your health care provider about taking iron pills and increasing the amount of dark green, leafy vegetables in your diet. These actions can help to boost your blood iron levels, which may be affected by heavy menstrual bleeding. °· Pay close attention to your period and tell your health care provider about any changes, such as: °¨ Increased blood flow that requires you to use more pads or tampons than usual per month. °¨ A change in the number of days that your period lasts per month. °¨ A change in symptoms that are associated with your period, such as abdominal cramping or back pain. °SEEK MEDICAL CARE IF: °· You have pelvic pain, back pain, or abdominal cramps that cannot be controlled with medicines. °· You have an increase in bleeding between and during periods. °· You soak tampons or pads in a half hour or less. °· You feel lightheaded, extra tired, or weak. °SEEK IMMEDIATE MEDICAL CARE IF: °· You faint. °· You have a sudden increase in pelvic pain. °  °This information is not intended to replace advice given to you by your health care provider. Make sure you discuss any questions you have with your health care provider. °  °Document Released: 10/12/2000 Document Revised: 11/05/2014 Document Reviewed: 04/13/2014 °Elsevier Interactive Patient Education ©2016 Elsevier Inc. ° °

## 2016-04-01 ENCOUNTER — Encounter: Payer: Self-pay | Admitting: Medical Oncology

## 2016-04-01 ENCOUNTER — Emergency Department
Admission: EM | Admit: 2016-04-01 | Discharge: 2016-04-01 | Disposition: A | Discharge status: Home / Self Care | Payer: Self-pay | Attending: Emergency Medicine | Admitting: Emergency Medicine

## 2016-04-01 ENCOUNTER — Emergency Department: Payer: Self-pay

## 2016-04-01 DIAGNOSIS — Y999 Unspecified external cause status: Secondary | ICD-10-CM | POA: Insufficient documentation

## 2016-04-01 DIAGNOSIS — S20211A Contusion of right front wall of thorax, initial encounter: Secondary | ICD-10-CM | POA: Insufficient documentation

## 2016-04-01 DIAGNOSIS — F319 Bipolar disorder, unspecified: Secondary | ICD-10-CM | POA: Insufficient documentation

## 2016-04-01 DIAGNOSIS — Z79899 Other long term (current) drug therapy: Secondary | ICD-10-CM | POA: Insufficient documentation

## 2016-04-01 DIAGNOSIS — Y9372 Activity, wrestling: Secondary | ICD-10-CM | POA: Insufficient documentation

## 2016-04-01 DIAGNOSIS — X501XXA Overexertion from prolonged static or awkward postures, initial encounter: Secondary | ICD-10-CM | POA: Insufficient documentation

## 2016-04-01 DIAGNOSIS — Y929 Unspecified place or not applicable: Secondary | ICD-10-CM | POA: Insufficient documentation

## 2016-04-01 MED ORDER — KETOROLAC TROMETHAMINE 30 MG/ML IJ SOLN
30.0000 mg | Freq: Once | INTRAMUSCULAR | Status: AC
  Administered 2016-04-01: 30 mg via INTRAMUSCULAR
  Filled 2016-04-01: qty 1

## 2016-04-01 MED ORDER — NAPROXEN 500 MG PO TABS: 500.0000 mg | ORAL_TABLET | Freq: Two times a day (BID) | ORAL | Status: DC

## 2016-04-01 NOTE — Discharge Instructions (Signed)
Chest Contusion A chest contusion is a deep bruise on your chest area. Contusions are the result of an injury that caused bleeding under the skin. A chest contusion may involve bruising of the skin, muscles, or ribs. The contusion may turn blue, purple, or yellow. Minor injuries will give you a painless contusion, but more severe contusions may stay painful and swollen for a few weeks. CAUSES  A contusion is usually caused by a blow, trauma, or direct force to an area of the body. SYMPTOMS   Swelling and redness of the injured area.  Discoloration of the injured area.  Tenderness and soreness of the injured area.  Pain. DIAGNOSIS  The diagnosis can be made by taking a history and performing a physical exam. An X-ray, CT scan, or MRI may be needed to determine if there were any associated injuries, such as broken bones (fractures) or internal injuries. TREATMENT  Often, the best treatment for a chest contusion is resting, icing, and applying cold compresses to the injured area. Deep breathing exercises may be recommended to reduce the risk of pneumonia. Over-the-counter medicines may also be recommended for pain control. HOME CARE INSTRUCTIONS   Put ice on the injured area.  Put ice in a plastic bag.  Place a towel between your skin and the bag.  Leave the ice on for 15-20 minutes, 03-04 times a day.  Only take over-the-counter or prescription medicines as directed by your caregiver. Your caregiver may recommend avoiding anti-inflammatory medicines (aspirin, ibuprofen, and naproxen) for 48 hours because these medicines may increase bruising.  Rest the injured area.  Perform deep-breathing exercises as directed by your caregiver.  Stop smoking if you smoke.  Do not lift objects over 5 pounds (2.3 kg) for 3 days or longer if recommended by your caregiver. SEEK IMMEDIATE MEDICAL CARE IF:   You have increased bruising or swelling.  You have pain that is getting worse.  You have  difficulty breathing.  You have dizziness, weakness, or fainting.  You have blood in your urine or stool.  You cough up or vomit blood.  Your swelling or pain is not relieved with medicines. MAKE SURE YOU:   Understand these instructions.  Will watch your condition.  Will get help right away if you are not doing well or get worse.   This information is not intended to replace advice given to you by your health care provider. Make sure you discuss any questions you have with your health care provider.   Document Released: 07/10/2001 Document Revised: 07/09/2012 Document Reviewed: 04/07/2012 Elsevier Interactive Patient Education 2016 [place].  Cryotherapy Cryotherapy means treatment with cold. Ice or gel packs can be used to reduce both pain and swelling. Ice is the most helpful within the first 24 to 48 hours after an injury or flare-up from overusing a muscle or joint. Sprains, strains, spasms, burning pain, shooting pain, and aches can all be eased with ice. Ice can also be used when recovering from surgery. Ice is effective, has very few side effects, and is safe for most people to use. PRECAUTIONS  Ice is not a safe treatment option for people with:  Raynaud phenomenon. This is a condition affecting small blood vessels in the extremities. Exposure to cold may cause your problems to return.  Cold hypersensitivity. There are many forms of cold hypersensitivity, including:  Cold urticaria. Red, itchy hives appear on the skin when the tissues begin to warm after being iced.  Cold erythema. This is a red,  itchy rash caused by exposure to cold.  Cold hemoglobinuria. Red blood cells break down when the tissues begin to warm after being iced. The hemoglobin that carry oxygen are passed into the urine because they cannot combine with blood proteins fast enough.  Numbness or altered sensitivity in the area being iced. If you have any of the following conditions, do not use ice  until you have discussed cryotherapy with your caregiver:  Heart conditions, such as arrhythmia, angina, or chronic heart disease.  High blood pressure.  Healing wounds or open skin in the area being iced.  Current infections.  Rheumatoid arthritis.  Poor circulation.  Diabetes. Ice slows the blood flow in the region it is applied. This is beneficial when trying to stop inflamed tissues from spreading irritating chemicals to surrounding tissues. However, if you expose your skin to cold temperatures for too long or without the proper protection, you can damage your skin or nerves. Watch for signs of skin damage due to cold. HOME CARE INSTRUCTIONS Follow these tips to use ice and cold packs safely.  Place a dry or damp towel between the ice and skin. A damp towel will cool the skin more quickly, so you may need to shorten the time that the ice is used.  For a more rapid response, add gentle compression to the ice.  Ice for no more than 10 to 20 minutes at a time. The bonier the area you are icing, the less time it will take to get the benefits of ice.  Check your skin after 5 minutes to make sure there are no signs of a poor response to cold or skin damage.  Rest 20 minutes or more between uses.  Once your skin is numb, you can end your treatment. You can test numbness by very lightly touching your skin. The touch should be so light that you do not see the skin dimple from the pressure of your fingertip. When using ice, most people will feel these normal sensations in this order: cold, burning, aching, and numbness.  Do not use ice on someone who cannot communicate their responses to pain, such as small children or people with dementia. HOW TO MAKE AN ICE PACK Ice packs are the most common way to use ice therapy. Other methods include ice massage, ice baths, and cryosprays. Muscle creams that cause a cold, tingly feeling do not offer the same benefits that ice offers and should not be  used as a substitute unless recommended by your caregiver. To make an ice pack, do one of the following:  Place crushed ice or a bag of frozen vegetables in a sealable plastic bag. Squeeze out the excess air. Place this bag inside another plastic bag. Slide the bag into a pillowcase or place a damp towel between your skin and the bag.  Mix 3 parts water with 1 part rubbing alcohol. Freeze the mixture in a sealable plastic bag. When you remove the mixture from the freezer, it will be slushy. Squeeze out the excess air. Place this bag inside another plastic bag. Slide the bag into a pillowcase or place a damp towel between your skin and the bag. SEEK MEDICAL CARE IF:  You develop white spots on your skin. This may give the skin a blotchy (mottled) appearance.  Your skin turns blue or pale.  Your skin becomes waxy or hard.  Your swelling gets worse. MAKE SURE YOU:   Understand these instructions.  Will watch your condition.  Will get  help right away if you are not doing well or get worse.   This information is not intended to replace advice given to you by your health care provider. Make sure you discuss any questions you have with your health care provider.   Document Released: 06/11/2011 Document Revised: 11/05/2014 Document Reviewed: 06/11/2011 Elsevier Interactive Patient Education [employer].

## 2016-04-01 NOTE — ED Notes (Signed)
states she was playing around with her b/f  Twisted the wrong way  Having pain to right lateral rib and back area  Increased pain with inspiration and movement

## 2016-04-01 NOTE — ED Notes (Signed)
Pt reports she was "wrestling around" last night when she began to have pain to the rt rib cage and around to her back. Pt ambulatory.

## 2016-04-01 NOTE — ED Provider Notes (Signed)
[facility] Emergency Department Provider Note  ____________________________________________  Time seen: Approximately 10:57 AM  I have reviewed the triage vital signs and the nursing notes.   HISTORY  Chief Complaint Back Pain and rib pain     HPI Stephanie Holland is a 50 y.o. female , NAD, presents to the emergency department with two-day history of right lower rib pain. States she was "wrestling" with her boyfriend was taken her last night. States that she somehow moved her lower body to the left lumbar of a party moved to the right and felt a pain in her lower ribs. Has been taking ibuprofen over the last day without any significant relief of her pain. States it hurts about the right lower rib cage with deep inspiration, when she coughs or if she sneezes. Has not noted any redness, swelling, open wounds, skin sores no bruising about the area. Has not had any blunt trauma to the area in question. Denies chest pain or shortness of breath. No numbness, weakness, tingling. Denies any lower back pain.   Past Medical History  Diagnosis Date  . Bipolar 1 disorder, depressed ([place])   . Insomnia     There are no active problems to display for this patient.   Past Surgical History  Procedure Laterality Date  . Appendectomy      Current Outpatient Rx  Name  Route  Sig  Dispense  Refill  . buPROPion (WELLBUTRIN SR) 150 MG 12 hr tablet   Oral   Take 150 mg by mouth 2 (two) times daily.         . naproxen (NAPROSYN) 500 MG tablet   Oral   Take 1 tablet (500 mg total) by mouth 2 (two) times daily with a meal.   30 tablet   0   . traZODone (DESYREL) 100 MG tablet   Oral   Take 1 tablet (100 mg total) by mouth at bedtime.   30 tablet   1   . traZODone (DESYREL) 50 MG tablet   Oral   Take 50 mg by mouth at bedtime.           Allergies Review of patient's allergies indicates no known allergies.  No family history on file.  Social History Social  History  Substance Use Topics  . Smoking status: Never Smoker   . Smokeless tobacco: None  . Alcohol Use: No     Review of Systems  Constitutional: No fever/chills, fatigue Eyes: No visual changes.  Cardiovascular: No chest pain. Respiratory: No shortness of breath. No wheezing.  Gastrointestinal: No abdominal pain.  No nausea, vomiting.   Musculoskeletal: Pain about the right lower rib cage. Negative for back, neck pain.  Skin: Negative for rash, lacerations, bruising. Neurological: Negative for headaches, focal weakness or numbness. No tingling 10-point ROS otherwise negative.  ____________________________________________   PHYSICAL EXAM:  VITAL SIGNS: ED Triage Vitals  Enc Vitals Group     BP 04/01/16 1030 155/102 mmHg     Pulse Rate 04/01/16 1030 74     Resp 04/01/16 1030 18     Temp 04/01/16 1030 98.8 F (37.1 C)     Temp Source 04/01/16 1030 Oral     SpO2 04/01/16 1030 99 %     Weight 04/01/16 1030 180 lb (81.647 kg)     Height 04/01/16 1030 5\' 7"  (1.702 m)     Head Cir --      Peak Flow --      Pain Score  04/01/16 1031 9     Pain Loc --      Pain Edu? --      Excl. in [place]? --      Constitutional: Alert and oriented. Well appearing and in no acute distress, but in pain with movement. Eyes: Conjunctivae are normal.  Head: Atraumatic. Neck: Supple with full range of motion. Hematological/Lymphatic/Immunilogical: No cervical lymphadenopathy. Cardiovascular: Normal rate, regular rhythm. Grossly normal heart sounds. Good peripheral circulation with 2+ pulses in the right upper extremity. Respiratory: Normal respiratory effort without tachypnea or retractions. Lungs CTAB with breath sounds noted in all lung fields. Gastrointestinal: Soft and nontender without distention, guarding in all quadrants.  Musculoskeletal: No redness to palpation about the right, anterior, lower rib cage without crepitus or bony deformity to palpation.  Neurologic:  Normal speech and  language. No gross focal neurologic deficits are appreciated. Sensation to light touch grossly intact about the right anterior lower rib cage. Skin:  Skin is warm, dry and intact. No rash, bruising, open wounds, skin source, lacerations noted. Psychiatric: Mood and affect are normal. Speech and behavior are normal. Patient exhibits appropriate insight and judgement.   ____________________________________________   LABS  None ____________________________________________  EKG  None ____________________________________________  RADIOLOGY I have personally viewed and evaluated these images (plain radiographs) as part of my medical decision making, as well as reviewing the written report by the radiologist.  Dg Ribs Unilateral W/chest Right  04/01/2016  CLINICAL DATA:  Wrestling injury EXAM: RIGHT RIBS AND CHEST - 3+ VIEW COMPARISON:  06/05/2013 FINDINGS: No fracture or other bone lesions are seen involving the ribs. There is no evidence of pneumothorax or pleural effusion. Both lungs are clear. Heart size and mediastinal contours are within normal limits. IMPRESSION: Negative. Electronically Signed   By: Kerby Moors M.D.   On: 04/01/2016 11:21    ____________________________________________    PROCEDURES  Procedure(s) performed: None   Medications  ketorolac (TORADOL) 30 MG/ML injection 30 mg (30 mg Intramuscular Given 04/01/16 1202)     ____________________________________________   INITIAL IMPRESSION / ASSESSMENT AND PLAN / ED COURSE  Pertinent imaging results that were available during my care of the patient were reviewed by me and considered in my medical decision making (see chart for details).  Patient's diagnosis is consistent with right chest wall contusion. Patient will be discharged home with prescriptions for naproxen to take as directed. Patient advised to apply ice to the effected area 20 minutes 3-4 times daily to decrease pain and swelling. Patient also use a  pillow if she needs to cough or sneeze and hold to the affected area to decrease pain. Patient was given a work note to excuse from work today and tomorrow due to injury. Patient is to follow up with her primary care provider or [facility] if symptoms persist past this treatment course. Patient is given ED precautions to return to the ED for any worsening or new symptoms.    ____________________________________________  FINAL CLINICAL IMPRESSION(S) / ED DIAGNOSES  Final diagnoses:  Chest wall contusion, right, initial encounter      NEW MEDICATIONS STARTED DURING THIS VISIT:  Discharge Medication List as of 04/01/2016 11:39 AM    START taking these medications   Details  naproxen (NAPROSYN) 500 MG tablet Take 1 tablet (500 mg total) by mouth 2 (two) times daily with a meal., Starting 04/01/2016, Until Discontinued, Print         Jami L Hagler, PA-C 04/01/16 1147   ----------------------------------------- 11:57 AM  on 04/01/2016 -----------------------------------------  Patient was initially offered Toradol injection during my initial assessment but declined. At the time of discharge patient now wants the Toradol injection. I have ordered Toradol 30 mg IM and we will monitor the patient for tolerance.  Patient noted decrease in pain after Toradol 30mg  Im was given. She was discharged without being in any acute distress, ambulating without assistance.  Braxton Feathers, PA-C 04/01/16 1326  Orbie Pyo, MD 04/01/16 [phone]

## 2016-04-24 ENCOUNTER — Emergency Department
Admission: EM | Admit: 2016-04-24 | Discharge: 2016-04-24 | Disposition: A | Discharge status: Home / Self Care | Payer: Self-pay | Attending: Emergency Medicine | Admitting: Emergency Medicine

## 2016-04-24 DIAGNOSIS — J019 Acute sinusitis, unspecified: Secondary | ICD-10-CM | POA: Insufficient documentation

## 2016-04-24 DIAGNOSIS — T700XXA Otitic barotrauma, initial encounter: Secondary | ICD-10-CM | POA: Insufficient documentation

## 2016-04-24 DIAGNOSIS — F319 Bipolar disorder, unspecified: Secondary | ICD-10-CM | POA: Insufficient documentation

## 2016-04-24 LAB — POCT RAPID STREP A: STREPTOCOCCUS, GROUP A SCREEN (DIRECT): NEGATIVE

## 2016-04-24 MED ORDER — METHYLPREDNISOLONE 4 MG PO TBPK: ORAL_TABLET | ORAL | Status: DC

## 2016-04-24 MED ORDER — MAGIC MOUTHWASH
10.0000 mL | Freq: Once | ORAL | Status: AC
  Administered 2016-04-24: 10 mL via ORAL
  Filled 2016-04-24: qty 10

## 2016-04-24 MED ORDER — MAGIC MOUTHWASH: 5.0000 mL | Freq: Three times a day (TID) | ORAL | Status: DC | PRN

## 2016-04-24 MED ORDER — AMOXICILLIN-POT CLAVULANATE 875-125 MG PO TABS
1.0000 | ORAL_TABLET | Freq: Once | ORAL | Status: AC
  Administered 2016-04-24: 1 via ORAL
  Filled 2016-04-24: qty 1

## 2016-04-24 MED ORDER — AMOXICILLIN-POT CLAVULANATE 875-125 MG PO TABS: 1.0000 | ORAL_TABLET | Freq: Two times a day (BID) | ORAL | Status: DC

## 2016-04-24 NOTE — Discharge Instructions (Signed)
1. Take antibiotic as prescribed (Augmentin 875 mg twice daily 7 days). 2. You may use Magic mouthwash as needed for throat pain. 3. Return to the ER for worsening symptoms, persistent vomiting, difficulty breathing or other concerns.  Sinusitis, Adult Sinusitis is redness, soreness, and puffiness (inflammation) of the air pockets in the bones of your face (sinuses). The redness, soreness, and puffiness can cause air and mucus to get trapped in your sinuses. This can allow germs to grow and cause an infection.  HOME CARE   Drink enough fluids to keep your pee (urine) clear or pale yellow.  Use a humidifier in your home.  Run a hot shower to create steam in the bathroom. Sit in the bathroom with the door closed. Breathe in the steam 3-4 times a day.  Put a warm, moist washcloth on your face 3-4 times a day, or as told by your doctor.  Use salt water sprays (saline sprays) to wet the thick fluid in your nose. This can help the sinuses drain.  Only take medicine as told by your doctor. GET HELP RIGHT AWAY IF:   Your pain gets worse.  You have very bad headaches.  You are sick to your stomach (nauseous).  You throw up (vomit).  You are very sleepy (drowsy) all the time.  Your face is puffy (swollen).  Your vision changes.  You have a stiff neck.  You have trouble breathing. MAKE SURE YOU:   Understand these instructions.  Will watch your condition.  Will get help right away if you are not doing well or get worse.   This information is not intended to replace advice given to you by your health care provider. Make sure you discuss any questions you have with your health care provider.   Document Released: 04/02/2008 Document Revised: 11/05/2014 Document Reviewed: 05/20/2012 Elsevier Interactive Patient Education 2016 [place] media is inflammation of your middle ear. This occurs when the auditory tube (eustachian tube) leading from the  back of your nose (nasopharynx) to your eardrum is blocked. This blockage may result from a cold, environmental allergies, or an upper respiratory infection. Unresolved barotitis media may lead to damage or hearing loss (barotrauma), which may become permanent. HOME CARE INSTRUCTIONS   Use medicines as recommended by your health care provider. Over-the-counter medicines will help unblock the canal and can help during times of air travel.  Do not put anything into your ears to clean or unplug them. Eardrops will not be helpful.  Do not swim, dive, or fly until your health care provider says it is all right to do so. If these activities are necessary, chewing gum with frequent, forceful swallowing may help. It is also helpful to hold your nose and gently blow to pop your ears for equalizing pressure changes. This forces air into the eustachian tube.  Only take over-the-counter or prescription medicines for pain, discomfort, or fever as directed by your health care provider.  A decongestant may be helpful in decongesting the middle ear and make pressure equalization easier. SEEK MEDICAL CARE IF:  You experience a serious form of dizziness in which you feel as if the room is spinning and you feel nauseated (vertigo).  Your symptoms only involve one ear. SEEK IMMEDIATE MEDICAL CARE IF:   You develop a severe headache, dizziness, or severe ear pain.  You have bloody or pus-like drainage from your ears.  You develop a fever.  Your problems do not improve or become worse.  MAKE SURE YOU:   Understand these instructions.  Will watch your condition.  Will get help right away if you are not doing well or get worse.   This information is not intended to replace advice given to you by your health care provider. Make sure you discuss any questions you have with your health care provider.   Document Released: 10/12/2000 Document Revised: 08/05/2013 Document Reviewed: 05/12/2013 Elsevier  Interactive Patient Education [employer].

## 2016-04-24 NOTE — ED Notes (Signed)
Pt in with nasal congestion, facial pressure, bilat ear fullness. Now also co voice being hoarse and has been taking otc allergy without relief.

## 2016-04-24 NOTE — ED Provider Notes (Signed)
[facility] Emergency Department Provider Note   ____________________________________________  Time seen: Approximately 4:20 AM  I have reviewed the triage vital signs and the nursing notes.   HISTORY  Chief Complaint Nasal Congestion    HPI Stephanie Holland is a 50 y.o. female who presents to the ED from home with a chief complaint of nasal congestion, facial pressure, bilateral ear fullness, laryngitis and nonproductive cough. Patient reports symptoms intermittently for the past 2 weeks, worsening for the past day. Describes green discharge from nose and hoarseness. Denies associated fever, chills, chest pain, shortness of breath, abdominal pain, nausea, vomiting, diarrhea. Denies recent travel or trauma. Patient has tried OTC allergy medicines without relief of symptoms.   Past Medical History  Diagnosis Date  . Bipolar 1 disorder, depressed ([place])   . Insomnia     There are no active problems to display for this patient.   Past Surgical History  Procedure Laterality Date  . Appendectomy      Current Outpatient Rx  Name  Route  Sig  Dispense  Refill  . amoxicillin-clavulanate (AUGMENTIN) 875-125 MG tablet   Oral   Take 1 tablet by mouth 2 (two) times daily.   14 tablet   0   . buPROPion (WELLBUTRIN SR) 150 MG 12 hr tablet   Oral   Take 150 mg by mouth 2 (two) times daily.         . magic mouthwash SOLN   Oral   Take 5 mLs by mouth 3 (three) times daily as needed for mouth pain.   75 mL   0   . methylPREDNISolone (MEDROL DOSEPAK) 4 MG TBPK tablet      Take as directed   21 tablet   0   . naproxen (NAPROSYN) 500 MG tablet   Oral   Take 1 tablet (500 mg total) by mouth 2 (two) times daily with a meal.   30 tablet   0   . traZODone (DESYREL) 100 MG tablet   Oral   Take 1 tablet (100 mg total) by mouth at bedtime.   30 tablet   1   . traZODone (DESYREL) 50 MG tablet   Oral   Take 50 mg by mouth at bedtime.            Allergies Review of patient's allergies indicates no known allergies.  No family history on file.  Social History Social History  Substance Use Topics  . Smoking status: Never Smoker   . Smokeless tobacco: Not on file  . Alcohol Use: No    Review of Systems  Constitutional: No fever/chills. Eyes: No visual changes. ENT: Positive for sinus pressure, ear fullness and sore throat. Cardiovascular: Denies chest pain. Respiratory: Positive for nonproductive cough. Denies shortness of breath. Gastrointestinal: No abdominal pain.  No nausea, no vomiting.  No diarrhea.  No constipation. Genitourinary: Negative for dysuria. Musculoskeletal: Negative for back pain. Skin: Negative for rash. Neurological: Negative for headaches, focal weakness or numbness.  10-point ROS otherwise negative.  ____________________________________________   PHYSICAL EXAM:  VITAL SIGNS: ED Triage Vitals  Enc Vitals Group     BP 04/24/16 0212 136/89 mmHg     Pulse Rate 04/24/16 0212 95     Resp 04/24/16 0212 18     Temp 04/24/16 0212 98.9 F (37.2 C)     Temp Source 04/24/16 0212 Oral     SpO2 04/24/16 0212 97 %     Weight 04/24/16 0212 180 lb (81.647  kg)     Height 04/24/16 0212 5\' 7"  (1.702 m)     Head Cir --      Peak Flow --      Pain Score 04/24/16 0213 10     Pain Loc --      Pain Edu? --      Excl. in [place]? --     Constitutional: Alert and oriented. Well appearing and in no acute distress. Eyes: Conjunctivae are normal. PERRL. EOMI. Head: Atraumatic. Frontal maxillary sinuses tender to palpation. Ears: Bilateral TMs with mild fluid. Nose: Congestion/rhinnorhea. Mouth/Throat: Mucous membranes are moist.  Oropharynx mildly erythematous without tonsillar swelling, exudates or peritonsillar. Mildly hoarse voice. There is no muffled voice or drooling. Neck: No stridor.  Supple neck without meningismus. Hematological/Lymphatic/Immunilogical: [place] anterior cervical  lymphadenopathy. Cardiovascular: Normal rate, regular rhythm. Grossly normal heart sounds.  Good peripheral circulation. Respiratory: Hacking dry cough. Normal respiratory effort.  No retractions. Lungs CTAB. Gastrointestinal: Soft and nontender. No distention. No abdominal bruits. No CVA tenderness. Musculoskeletal: No lower extremity tenderness nor edema.  No joint effusions. Neurologic:  Normal speech and language. No gross focal neurologic deficits are appreciated. No gait instability. Skin:  Skin is warm, dry and intact. No rash noted. No petechiae. Psychiatric: Mood and affect are normal. Speech and behavior are normal.  ____________________________________________   LABS (all labs ordered are listed, but only abnormal results are displayed)  Labs Reviewed  POCT RAPID STREP A   ____________________________________________  EKG  None ____________________________________________  RADIOLOGY  None ____________________________________________   PROCEDURES  Procedure(s) performed: None  Critical Care performed: No  ____________________________________________   INITIAL IMPRESSION / ASSESSMENT AND PLAN / ED COURSE  Pertinent labs & imaging results that were available during my care of the patient were reviewed by me and considered in my medical decision making (see chart for details).  50 year old female who presents with sinusitis and barotitis media. Will place on Medrol Dosepak, Augmentin, Magic mouthwash and follow-up with her PCP. Strict return precautions given. Patient verbalizes understanding and agrees with plan of care. ____________________________________________   FINAL CLINICAL IMPRESSION(S) / ED DIAGNOSES  Final diagnoses:  Acute sinusitis, recurrence not specified, unspecified location  Barotitis media, initial encounter      NEW MEDICATIONS STARTED DURING THIS VISIT:  New Prescriptions   AMOXICILLIN-CLAVULANATE (AUGMENTIN) 875-125 MG TABLET     Take 1 tablet by mouth 2 (two) times daily.   MAGIC MOUTHWASH SOLN    Take 5 mLs by mouth 3 (three) times daily as needed for mouth pain.   METHYLPREDNISOLONE (MEDROL DOSEPAK) 4 MG TBPK TABLET    Take as directed     Note:  This document was prepared using Dragon voice recognition software and may include unintentional dictation errors.    Paulette Blanch, MD 04/24/16 [phone]

## 2016-04-26 LAB — CULTURE, GROUP A STREP (THRC)

## 2016-05-14 ENCOUNTER — Ambulatory Visit: Payer: Self-pay | Admitting: Urology

## 2016-05-14 ENCOUNTER — Encounter: Payer: Self-pay | Admitting: Urology

## 2016-08-06 ENCOUNTER — Encounter: Payer: Self-pay | Admitting: Emergency Medicine

## 2016-08-06 ENCOUNTER — Emergency Department
Admission: EM | Admit: 2016-08-06 | Discharge: 2016-08-06 | Disposition: A | Discharge status: Home / Self Care | Payer: Self-pay | Attending: Emergency Medicine | Admitting: Emergency Medicine

## 2016-08-06 DIAGNOSIS — G5601 Carpal tunnel syndrome, right upper limb: Secondary | ICD-10-CM | POA: Insufficient documentation

## 2016-08-06 DIAGNOSIS — Z79899 Other long term (current) drug therapy: Secondary | ICD-10-CM | POA: Insufficient documentation

## 2016-08-06 DIAGNOSIS — Z791 Long term (current) use of non-steroidal anti-inflammatories (NSAID): Secondary | ICD-10-CM | POA: Insufficient documentation

## 2016-08-06 MED ORDER — MELOXICAM 15 MG PO TABS: 15.0000 mg | ORAL_TABLET | Freq: Every day | ORAL | 0 refills | Status: DC

## 2016-08-06 NOTE — ED Notes (Signed)
See triage note  Pain with some numbness to right hand for several months  Denies any injury but does a lot of repetitve movement at work  Positive pulses  Grips equal

## 2016-08-06 NOTE — ED Triage Notes (Signed)
Pt presents to ED with reports of right hand pain that radiates to arm, elbow and shoulder. Pt reports intermittent numbness and tingling in right hand and arm for several months. Pt thinks she may have carpal tunnel syndrome due to repetitive motions at work. Pt reports intermittent swelling in both hands.

## 2016-08-06 NOTE — Discharge Instructions (Signed)
Please purchase a THUMB SPICA splint to wear at night and when working.   Follow up with orthopedics if not improving.

## 2016-08-06 NOTE — ED Provider Notes (Signed)
[facility] Emergency Department Provider Note  ____________________________________________  Time seen: Approximately 1:54 PM  I have reviewed the triage vital signs and the nursing notes.   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 Patient states she has had pain about the right wrist and hand for several months. Has noted over the last few days that the numbness and tingling in hand has worsened. Has taken over-the-counter medications with no relief of pain, numbness or tingling. Please she may have carpal tunnel syndrome due to repetitive motion as she has been a Scientist, water quality for some time. States that she has worked the last 9 days straight which she feels has aggravated the pain, numbness and tingling. Has not had any physical trauma or injuries to the right arm. Has not noted any skin sores, redness or abnormal warmth. Does note that her hand and fingers can swell which is worse in the mornings. States pain, numbness and tingling are worse at night. Denies chest pain, shortness breath, neck pain, fatigue.    Past Medical History:  Diagnosis Date  . Bipolar 1 disorder, depressed ([place])   . Insomnia     There are no active problems to display for this patient.   Past Surgical History:  Procedure Laterality Date  . APPENDECTOMY      Prior to Admission medications   Medication Sig Start Date End Date Taking? Authorizing Provider  buPROPion (WELLBUTRIN SR) 150 MG 12 hr tablet Take 150 mg by mouth 2 (two) times daily.    Historical Provider, MD  meloxicam (MOBIC) 15 MG tablet Take 1 tablet (15 mg total) by mouth daily. 08/06/16   Jamielee Mchale L Keera Altidor, PA-C  naproxen (NAPROSYN) 500 MG tablet Take 1 tablet (500 mg total) by mouth 2 (two) times daily with a meal. 04/01/16   Kadeja Granada L Rahsaan Weakland, PA-C  traZODone (DESYREL) 100 MG tablet  Take 1 tablet (100 mg total) by mouth at bedtime. 05/31/13   Fransico Meadow, PA-C  traZODone (DESYREL) 50 MG tablet Take 50 mg by mouth at bedtime.    Historical Provider, MD    Allergies Review of patient's allergies indicates no known allergies.  No family history on file.  Social History Social History  Substance Use Topics  . Smoking status: Never Smoker  . Smokeless tobacco: Not on file  . Alcohol use No     Review of Systems  Constitutional: No Fatigue Cardiovascular: No chest pain. Respiratory:No shortness of breath.  Musculoskeletal: Positive right wrist, hand and finger pain. Negative for neck, right shoulder pain.  Skin: Positive swelling right hand and fingers. Negative for rash, redness, abnormal warmth, skin sores. Neurological: Positive numbness, tingling right wrist, hand and fingers. Negative for headaches, focal weakness or numbness. 10-point ROS otherwise negative.  ____________________________________________   PHYSICAL EXAM:  VITAL SIGNS: ED Triage Vitals  Enc Vitals Group     BP 08/06/16 1320 (!) 143/88     Pulse Rate 08/06/16 1320 80     Resp 08/06/16 1320 20     Temp 08/06/16 1320 98.3 F (36.8 C)     Temp Source 08/06/16 1320 Oral     SpO2 08/06/16 1320 97 %     Weight 08/06/16 1320 175 lb (79.4 kg)     Height 08/06/16 1320 5\' 7"  (1.702 m)     Head Circumference --  Peak Flow --      Pain Score 08/06/16 1321 10     Pain Loc --      Pain Edu? --      Excl. in [place]? --      Constitutional: Alert and oriented. Well appearing and in no acute distress. Eyes: Conjunctivae are normal.  Head: Atraumatic. Neck: Supple with full range of motion Cardiovascular: Good peripheral circulation with 2+ pulses noted in the right upper extremity. Capillary refill is brisk in all digits of the right hand. Respiratory: Normal respiratory effort without tachypnea or retractions.  Musculoskeletal: Tenderness to palpation about the ventral portion of the  right wrist. No tenderness to palpation about the right hand or fingers. Positive Phalen's and Tinel's test. Full range of motion of the right shoulder, elbow without difficulty or pain. Neurologic:  Normal speech and language. No gross focal neurologic deficits are appreciated. Sensation light touch grossly intact about the right upper extremity. Skin:  Skin is warm, dry and intact. No rash, redness, swelling, skin sores noted. Psychiatric: Mood and affect are normal. Speech and behavior are normal. Patient exhibits appropriate insight and judgement.   ____________________________________________   LABS  None ____________________________________________  EKG  None ____________________________________________  RADIOLOGY  None ____________________________________________    PROCEDURES  Procedure(s) performed: None   Procedures   Medications - No data to display   ____________________________________________   INITIAL IMPRESSION / ASSESSMENT AND PLAN / ED COURSE  Pertinent labs & imaging results that were available during my care of the patient were reviewed by me and considered in my medical decision making (see chart for details).  Clinical Course    Patient's diagnosis is consistent with Right carpal tunnel syndrome. Patient will be discharged home with prescriptions for meloxicam to take as directed. Patient is advised to purchase a thumb spica splint from a local medical supply store to wear at night and while working. Patient was given a work note for today. Patient is to follow up with Dr. Roland Rack in orthopedics if symptoms persist past this treatment course. Patient is given ED precautions to return to the ED for any worsening or new symptoms.    ____________________________________________  FINAL CLINICAL IMPRESSION(S) / ED DIAGNOSES  Final diagnoses:  Carpal tunnel syndrome of right wrist      NEW MEDICATIONS STARTED DURING THIS VISIT:  Discharge  Medication List as of 08/06/2016  1:56 PM    START taking these medications   Details  meloxicam (MOBIC) 15 MG tablet Take 1 tablet (15 mg total) by mouth daily., Starting Mon 08/06/2016, Print             Judithe Modest Burr, PA-C 08/06/16 [place], MD 08/06/16 1511

## 2016-09-24 ENCOUNTER — Ambulatory Visit: Payer: Self-pay | Admitting: Urology

## 2016-10-05 ENCOUNTER — Ambulatory Visit: Payer: Self-pay

## 2016-10-30 ENCOUNTER — Ambulatory Visit: Payer: Self-pay | Admitting: Urology

## 2016-10-30 ENCOUNTER — Encounter: Payer: Self-pay | Admitting: Urology

## 2017-04-03 ENCOUNTER — Encounter: Payer: Self-pay | Admitting: Emergency Medicine

## 2017-04-03 ENCOUNTER — Emergency Department: Payer: Self-pay

## 2017-04-03 ENCOUNTER — Emergency Department
Admission: EM | Admit: 2017-04-03 | Discharge: 2017-04-03 | Disposition: A | Discharge status: Home / Self Care | Payer: Self-pay | Attending: Emergency Medicine | Admitting: Emergency Medicine

## 2017-04-03 DIAGNOSIS — J208 Acute bronchitis due to other specified organisms: Secondary | ICD-10-CM | POA: Insufficient documentation

## 2017-04-03 DIAGNOSIS — J302 Other seasonal allergic rhinitis: Secondary | ICD-10-CM | POA: Insufficient documentation

## 2017-04-03 DIAGNOSIS — J40 Bronchitis, not specified as acute or chronic: Secondary | ICD-10-CM

## 2017-04-03 DIAGNOSIS — J301 Allergic rhinitis due to pollen: Secondary | ICD-10-CM

## 2017-04-03 DIAGNOSIS — J0110 Acute frontal sinusitis, unspecified: Secondary | ICD-10-CM | POA: Insufficient documentation

## 2017-04-03 HISTORY — DX: Bipolar disorder, unspecified: F31.9

## 2017-04-03 MED ORDER — METHYLPREDNISOLONE SODIUM SUCC 125 MG IJ SOLR
125.0000 mg | Freq: Once | INTRAMUSCULAR | Status: AC
  Administered 2017-04-03: 125 mg via INTRAMUSCULAR
  Filled 2017-04-03: qty 2

## 2017-04-03 MED ORDER — CETIRIZINE HCL 10 MG PO TABS: 10.0000 mg | ORAL_TABLET | Freq: Every day | ORAL | 0 refills | Status: DC

## 2017-04-03 MED ORDER — ALBUTEROL SULFATE (2.5 MG/3ML) 0.083% IN NEBU
2.5000 mg | INHALATION_SOLUTION | Freq: Once | RESPIRATORY_TRACT | Status: AC
  Administered 2017-04-03: 2.5 mg via RESPIRATORY_TRACT
  Filled 2017-04-03: qty 3

## 2017-04-03 MED ORDER — PREDNISONE 50 MG PO TABS: 50.0000 mg | ORAL_TABLET | Freq: Every day | ORAL | 0 refills | Status: DC

## 2017-04-03 MED ORDER — AMOXICILLIN-POT CLAVULANATE 875-125 MG PO TABS: 1.0000 | ORAL_TABLET | Freq: Two times a day (BID) | ORAL | 0 refills | Status: DC

## 2017-04-03 MED ORDER — FLUTICASONE PROPIONATE 50 MCG/ACT NA SUSP: 1.0000 | Freq: Two times a day (BID) | NASAL | 0 refills | Status: DC

## 2017-04-03 MED ORDER — ALBUTEROL SULFATE HFA 108 (90 BASE) MCG/ACT IN AERS: 2.0000 | INHALATION_SPRAY | RESPIRATORY_TRACT | 0 refills | Status: DC | PRN

## 2017-04-03 NOTE — ED Provider Notes (Signed)
[facility] Emergency Department Provider Note  ____________________________________________  Time seen: Approximately 10:51 PM  I have reviewed the triage vital signs and the nursing notes.   HISTORY  Chief Complaint Cough and Nasal Congestion    HPI Stephanie Holland is a 51 y.o. female who presents emergency department complaining of a 5 day history of cough, nasal congestion, sinus pressure. Patient reports that she was seen in urgent care and diagnosed with "an infected tooth" and placed on amoxicillin and cough medication. Patient reports that the tooth in question has been decaying for several years. She denied any dental pain. Patient reports increased sinus congestion, sinus pressure, coughing. Patient denies any fevers or chills, she denies any difficulty breathing. Patient denies any headache, visual changes, neck pain, chest pain, abdominal pain, nausea vomiting. Patient has been taking amoxicillin and prescribed cough medication with minimal relief.   Past Medical History:  Diagnosis Date  . Bipolar 1 disorder, depressed (Stephanie Holland)   . Insomnia   . 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   Past Surgical History:  Procedure Laterality Date  . APPENDECTOMY      Prior to Admission medications   Medication Sig Start Date End Date Taking? Authorizing Provider  albuterol (PROVENTIL HFA;VENTOLIN HFA) 108 (90 Base) MCG/ACT inhaler Inhale 2 puffs into the lungs every 4 (four) hours as needed for wheezing or shortness of breath. 04/03/17   Cuthriell, Charline Bills, PA-C  amoxicillin-clavulanate (AUGMENTIN) 875-125 MG tablet Take 1 tablet by mouth 2 (two) times daily. 04/03/17   Cuthriell, Charline Bills, PA-C  buPROPion (WELLBUTRIN SR) 150 MG 12 hr tablet Take 150 mg by mouth 2 (two) times daily.    [provider]  cetirizine (ZYRTEC) 10 MG tablet Take 1 tablet (10 mg total) by mouth daily. 04/03/17   Cuthriell,  Charline Bills, PA-C  fluticasone (FLONASE) 50 MCG/ACT nasal spray Place 1 spray into both nostrils 2 (two) times daily. 04/03/17   Cuthriell, Charline Bills, PA-C  meloxicam (MOBIC) 15 MG tablet Take 1 tablet (15 mg total) by mouth daily. 08/06/16   Hagler, Jami L, PA-C  naproxen (NAPROSYN) 500 MG tablet Take 1 tablet (500 mg total) by mouth 2 (two) times daily with a meal. 04/01/16   Hagler, Jami L, PA-C  predniSONE (DELTASONE) 50 MG tablet Take 1 tablet (50 mg total) by mouth daily with breakfast. 04/03/17   Cuthriell, Charline Bills, PA-C  traZODone (DESYREL) 100 MG tablet Take 1 tablet (100 mg total) by mouth at bedtime. 05/31/13   Fransico Meadow, PA-C  traZODone (DESYREL) 50 MG tablet Take 50 mg by mouth at bedtime.    [provider]    Allergies Patient has no known allergies.  No family history on file.  Social History Social History  Substance Use Topics  . Smoking status: Never Smoker  . Smokeless tobacco: Not on file  . Alcohol use Yes     Review of Systems  Constitutional: No fever/chills Eyes: No visual changes. No discharge ENT: Positive for nasal congestion and sinus pressure Cardiovascular: no chest pain. Respiratory: Positive cough. No SOB. Gastrointestinal: No abdominal pain.  No nausea, no vomiting.  No diarrhea.  No constipation. Musculoskeletal: Negative for musculoskeletal pain. Skin: Negative for rash, abrasions, lacerations, ecchymosis. Neurological: Negative for headaches, focal weakness or numbness. 10-point ROS otherwise negative.  ____________________________________________   PHYSICAL EXAM:  VITAL SIGNS: ED Triage Vitals  Enc Vitals Group  BP 04/03/17 2202 (!) 169/88     Pulse --      Resp --      Temp 04/03/17 2202 98.2 F (36.8 C)     Temp Source 04/03/17 2202 Oral     SpO2 04/03/17 2202 97 %     Weight 04/03/17 2202 175 lb (79.4 kg)     Height 04/03/17 2202 5\' 7"  (1.702 m)     Head Circumference --      Peak Flow --      Pain Score  04/03/17 2207 8     Pain Loc --      Pain Edu? --      Excl. in [place]? --      Constitutional: Alert and oriented. Well appearing and in no acute distress. Eyes: Conjunctivae are normal. PERRL. EOMI. Head: Atraumatic. ENT:      Ears: EACs and TMs are unremarkable bilaterally      Nose: Moderate purulent congestion/rhinnorhea. Turbinates are boggy. Patient is tender percussion over the frontal sinuses.      Mouth/Throat: Mucous membranes are moist. Dental erosion noted to right lower first molar. No signs of erythema or edema consistent with infection of this time. He was midline. Tonsils are unremarkable bilaterally. Neck: No stridor.   Hematological/Lymphatic/Immunilogical: No cervical lymphadenopathy. Cardiovascular: Normal rate, regular rhythm. Normal S1 and S2.  Good peripheral circulation. Respiratory: Normal respiratory effort without tachypnea or retractions. Lungs with diffuse wheezing bilaterally. No rales or rhonchi.Kermit Balo air entry to the bases with no decreased or absent breath sounds. Musculoskeletal: Full range of motion to all extremities. No gross deformities appreciated. Neurologic:  Normal speech and language. No gross focal neurologic deficits are appreciated.  Skin:  Skin is warm, dry and intact. No rash noted. Psychiatric: Mood and affect are normal. Speech and behavior are normal. Patient exhibits appropriate insight and judgement.   ____________________________________________   LABS (all labs ordered are listed, but only abnormal results are displayed)  Labs Reviewed - No data to display ____________________________________________  EKG   ____________________________________________  RADIOLOGY Diamantina Providence Cuthriell, personally viewed and evaluated these images (plain radiographs) as part of my medical decision making, as well as reviewing the written report by the radiologist.  Dg Chest 2 View  Result Date: 04/03/2017 CLINICAL DATA:  Cough and  congestion EXAM: CHEST  2 VIEW COMPARISON:  Chest radiograph 04/01/2016 FINDINGS: The heart size and mediastinal contours are within normal limits. Both lungs are clear. The visualized skeletal structures are unremarkable. IMPRESSION: No active cardiopulmonary disease. Electronically Signed   By: Ulyses Jarred M.D.   On: 04/03/2017 22:37    ____________________________________________    PROCEDURES  Procedure(s) performed:    Procedures    Medications  albuterol (PROVENTIL) (2.5 MG/3ML) 0.083% nebulizer solution 2.5 mg (2.5 mg Nebulization Given 04/03/17 2311)  methylPREDNISolone sodium succinate (SOLU-MEDROL) 125 mg/2 mL injection 125 mg (125 mg Intramuscular Given 04/03/17 2311)     ____________________________________________   INITIAL IMPRESSION / ASSESSMENT AND PLAN / ED COURSE  Pertinent labs & imaging results that were available during my care of the patient were reviewed by me and considered in my medical decision making (see chart for details).  Review of the [place] CSRS was performed in accordance of the [place] prior to dispensing any controlled drugs.     Patient's diagnosis is consistent with frontal sinusitis, acute bronchitis EXACERBATED by seasonal allergic rhinitis. Patient was placed on amoxicillin which has not improved symptoms of bacterial sinusitis. Patient also has bronchitis  that is not well treated with cough medication. She has a history reveals no acute consolidation consistent with pneumonia. Peribronchial thickening consistent with bronchitis. At this time, patient was given albuterol inhaler and steroids for wheezing from bronchitis. This improved symptoms somewhat.. Patient will be discharged home with prescriptions for Augmentin, Flonase, Zyrtec, steroids, albuterol. Patient is to follow up with primary care as needed or otherwise directed. Patient is given ED precautions to return to the ED for any worsening or new  symptoms.     ____________________________________________  FINAL CLINICAL IMPRESSION(S) / ED DIAGNOSES  Final diagnoses:  Acute non-recurrent frontal sinusitis  Seasonal allergic rhinitis due to pollen  Bronchitis      NEW MEDICATIONS STARTED DURING THIS VISIT:  Discharge Medication List as of 04/03/2017 11:17 PM    START taking these medications   Details  albuterol (PROVENTIL HFA;VENTOLIN HFA) 108 (90 Base) MCG/ACT inhaler Inhale 2 puffs into the lungs every 4 (four) hours as needed for wheezing or shortness of breath., Starting Wed 04/03/2017, Print    amoxicillin-clavulanate (AUGMENTIN) 875-125 MG tablet Take 1 tablet by mouth 2 (two) times daily., Starting Wed 04/03/2017, Print    cetirizine (ZYRTEC) 10 MG tablet Take 1 tablet (10 mg total) by mouth daily., Starting Wed 04/03/2017, Print    fluticasone (FLONASE) 50 MCG/ACT nasal spray Place 1 spray into both nostrils 2 (two) times daily., Starting Wed 04/03/2017, Print    predniSONE (DELTASONE) 50 MG tablet Take 1 tablet (50 mg total) by mouth daily with breakfast., Starting Wed 04/03/2017, Print            This chart was dictated using voice recognition software/Dragon. Despite best efforts to proofread, errors can occur which can change the meaning. Any change was purely unintentional.    Darletta Moll, PA-C 04/03/17 2355    Earleen Newport, MD 04/04/17 [phone]

## 2017-04-03 NOTE — ED Triage Notes (Signed)
Pt presents with frequent cough and congestion since Saturday. Prescribed amoxicillin at the walk-in clinic for an infected tooth and given cough medication yesterday for her current symptoms. Pt states she is not feeling any better.

## 2017-08-26 ENCOUNTER — Emergency Department
Admission: EM | Admit: 2017-08-26 | Discharge: 2017-08-26 | Disposition: A | Discharge status: Home / Self Care | Payer: Self-pay | Attending: Emergency Medicine | Admitting: Emergency Medicine

## 2017-08-26 ENCOUNTER — Encounter: Payer: Self-pay | Admitting: Emergency Medicine

## 2017-08-26 DIAGNOSIS — Z791 Long term (current) use of non-steroidal anti-inflammatories (NSAID): Secondary | ICD-10-CM | POA: Insufficient documentation

## 2017-08-26 DIAGNOSIS — Y939 Activity, unspecified: Secondary | ICD-10-CM | POA: Insufficient documentation

## 2017-08-26 DIAGNOSIS — Z79899 Other long term (current) drug therapy: Secondary | ICD-10-CM | POA: Insufficient documentation

## 2017-08-26 DIAGNOSIS — Y999 Unspecified external cause status: Secondary | ICD-10-CM | POA: Insufficient documentation

## 2017-08-26 DIAGNOSIS — S025XXA Fracture of tooth (traumatic), initial encounter for closed fracture: Secondary | ICD-10-CM | POA: Insufficient documentation

## 2017-08-26 DIAGNOSIS — Y929 Unspecified place or not applicable: Secondary | ICD-10-CM | POA: Insufficient documentation

## 2017-08-26 DIAGNOSIS — X58XXXA Exposure to other specified factors, initial encounter: Secondary | ICD-10-CM | POA: Insufficient documentation

## 2017-08-26 MED ORDER — TRAMADOL HCL 50 MG PO TABS: 50.0000 mg | ORAL_TABLET | Freq: Four times a day (QID) | ORAL | 0 refills | Status: AC | PRN

## 2017-08-26 MED ORDER — NAPROXEN 500 MG PO TABS
500.0000 mg | ORAL_TABLET | Freq: Once | ORAL | Status: AC
  Administered 2017-08-26: 500 mg via ORAL
  Filled 2017-08-26: qty 1

## 2017-08-26 MED ORDER — NAPROXEN 500 MG PO TABS: 500.0000 mg | ORAL_TABLET | Freq: Two times a day (BID) | ORAL | Status: DC

## 2017-08-26 MED ORDER — AMOXICILLIN 500 MG PO CAPS: 500.0000 mg | ORAL_CAPSULE | Freq: Three times a day (TID) | ORAL | 0 refills | Status: DC

## 2017-08-26 MED ORDER — LIDOCAINE VISCOUS 2 % MT SOLN
15.0000 mL | Freq: Once | OROMUCOSAL | Status: AC
  Administered 2017-08-26: 15 mL via OROMUCOSAL
  Filled 2017-08-26: qty 15

## 2017-08-26 NOTE — ED Provider Notes (Signed)
[facility] Emergency Department Provider Note   ____________________________________________   First MD Initiated Contact with Patient 08/26/17 1135     (approximate)  I have reviewed the triage vital signs and the nursing notes.   HISTORY  Chief Complaint Dental Pain    HPI Stephanie Holland is a 51 y.o. female patient complaining of right lower jaw pain secondary to fractured teeth. Patient rates the pain as a 9/10. Patient described a pain as "achy". Patient state there was initial swelling to the right lateral mandible point in awakened has resolved with taking 600 mg ibuprofen. No other palliative measures for complaint.   Past Medical History:  Diagnosis Date  . Bipolar 1 disorder, depressed ([place])   . Insomnia   . Manic depression ([place])     There are no active problems to display for this patient.   Past Surgical History:  Procedure Laterality Date  . APPENDECTOMY      Prior to Admission medications   Medication Sig Start Date End Date Taking? Authorizing Provider  albuterol (PROVENTIL HFA;VENTOLIN HFA) 108 (90 Base) MCG/ACT inhaler Inhale 2 puffs into the lungs every 4 (four) hours as needed for wheezing or shortness of breath. 04/03/17   Cuthriell, Charline Bills, PA-C  amoxicillin (AMOXIL) 500 MG capsule Take 1 capsule (500 mg total) by mouth 3 (three) times daily. 08/26/17   Sable Feil, PA-C  amoxicillin-clavulanate (AUGMENTIN) 875-125 MG tablet Take 1 tablet by mouth 2 (two) times daily. 04/03/17   Cuthriell, Charline Bills, PA-C  buPROPion (WELLBUTRIN SR) 150 MG 12 hr tablet Take 150 mg by mouth 2 (two) times daily.    [provider]  cetirizine (ZYRTEC) 10 MG tablet Take 1 tablet (10 mg total) by mouth daily. 04/03/17   Cuthriell, Charline Bills, PA-C  fluticasone (FLONASE) 50 MCG/ACT nasal spray Place 1 spray into both nostrils 2 (two) times daily. 04/03/17   Cuthriell, Charline Bills, PA-C  meloxicam (MOBIC) 15 MG tablet Take 1 tablet (15 mg  total) by mouth daily. 08/06/16   Hagler, Jami L, PA-C  naproxen (NAPROSYN) 500 MG tablet Take 1 tablet (500 mg total) by mouth 2 (two) times daily with a meal. 04/01/16   Hagler, Jami L, PA-C  naproxen (NAPROSYN) 500 MG tablet Take 1 tablet (500 mg total) by mouth 2 (two) times daily with a meal. 08/26/17   Sable Feil, PA-C  predniSONE (DELTASONE) 50 MG tablet Take 1 tablet (50 mg total) by mouth daily with breakfast. 04/03/17   Cuthriell, Charline Bills, PA-C  traMADol (ULTRAM) 50 MG tablet Take 1 tablet (50 mg total) by mouth every 6 (six) hours as needed. 08/26/17 08/26/18  Sable Feil, PA-C  traZODone (DESYREL) 100 MG tablet Take 1 tablet (100 mg total) by mouth at bedtime. 05/31/13   Fransico Meadow, PA-C  traZODone (DESYREL) 50 MG tablet Take 50 mg by mouth at bedtime.    [provider]    Allergies Patient has no known allergies.  No family history on file.  Social History Social History  Substance Use Topics  . Smoking status: Never Smoker  . Smokeless tobacco: Never Used  . Alcohol use Yes    Review of Systems Constitutional: No fever/chills Eyes: No visual changes. ENT: Dental pain Cardiovascular: Denies chest pain. Respiratory: Denies shortness of breath. Gastrointestinal: No abdominal pain.  No nausea, no vomiting.  No diarrhea.  No constipation. Genitourinary: Negative for dysuria. Musculoskeletal: Negative for back pain. Skin: Negative for rash. Neurological:  Negative for headaches, focal weakness or numbness. Psychiatric:Bipolar, depression, and insomnia.  ____________________________________________   PHYSICAL EXAM:  VITAL SIGNS: ED Triage Vitals  Enc Vitals Group     BP 08/26/17 1127 (!) 124/58     Pulse Rate 08/26/17 1127 87     Resp 08/26/17 1127 20     Temp 08/26/17 1127 98.6 F (37 C)     Temp Source 08/26/17 1127 Oral     SpO2 08/26/17 1127 100 %     Weight 08/26/17 1110 174 lb (78.9 kg)     Height 08/26/17 1110 5\' 5"  (1.651 m)      Head Circumference --      Peak Flow --      Pain Score 08/26/17 1109 9     Pain Loc --      Pain Edu? --      Excl. in [place]? --    Constitutional: Alert and oriented. Well appearing and in no acute distress. Mouth/Throat: Mucous membranes are moist.  Oropharynx non-erythematous. Fractured tooth #29 and 30 with moderate gingival edema. Neck: No stridor.  Cardiovascular: Normal rate, regular rhythm. Grossly normal heart sounds.  Good peripheral circulation. Respiratory: Normal respiratory effort.  No retractions. Lungs CTAB. Neurologic:  Normal speech and language. No gross focal neurologic deficits are appreciated. No gait instability. Skin:  Skin is warm, dry and intact. No rash noted. Psychiatric: Mood and affect are normal. Speech and behavior are normal.  ____________________________________________   LABS (all labs ordered are listed, but only abnormal results are displayed)  Labs Reviewed - No data to display ____________________________________________  EKG   ____________________________________________  RADIOLOGY  No results found.  ____________________________________________   PROCEDURES  Procedure(s) performed:   Procedures  Critical Care performed: No  ____________________________________________   INITIAL IMPRESSION / ASSESSMENT AND PLAN / ED COURSE  As part of my medical decision making, I reviewed the following data within the [place] pain secondary to fractured tooth. Patient given a list of dental clinics for follow-up care. Patient given discharge care instructions. Patient given a work note for today.      ____________________________________________   FINAL CLINICAL IMPRESSION(S) / ED DIAGNOSES  Final diagnoses:  Closed fracture of tooth, initial encounter      NEW MEDICATIONS STARTED DURING THIS VISIT:  New Prescriptions   AMOXICILLIN (AMOXIL) 500 MG CAPSULE    Take 1 capsule (500 mg total) by  mouth 3 (three) times daily.   NAPROXEN (NAPROSYN) 500 MG TABLET    Take 1 tablet (500 mg total) by mouth 2 (two) times daily with a meal.   TRAMADOL (ULTRAM) 50 MG TABLET    Take 1 tablet (50 mg total) by mouth every 6 (six) hours as needed.     Note:  This document was prepared using Dragon voice recognition software and may include unintentional dictation errors.    Sable Feil, PA-C 08/26/17 1147    Lisa Roca, MD 08/26/17 [phone]

## 2017-08-26 NOTE — Discharge Instructions (Signed)
OPTIONS FOR DENTAL FOLLOW UP CARE ° °[place] Department of Health and Human Services - Local Safety Net Dental Clinics °http://www.ncdhhs.gov/dph/oralhealth/services/safetynetclinics.htm °  °[facility] (336-562-3123) ° °[facility] [place] (919-933-9087) ° °[facility] ([phone] ext [phone]) ° °[facility] ([phone]) ° °[facility] ([phone]) °This clinic caters to the indigent population and is on a lottery system. °Location: °[facility], [address], [place] °Clinic Hours: °Wednesdays from 6pm - 9pm, patients seen by a lottery system. °For dates, call or go to www.med.[facility].edu/[facility]/patients/Dental-[facility] °Services: °Cleanings, fillings and simple extractions. °Payment Options: °DENTAL WORK IS FREE OF CHARGE. Bring proof of income or support. °Best way to get seen: °Arrive at 5:15 pm - this is a lottery, NOT first come/first serve, so arriving earlier will not increase your chances of being seen. °  °  °[facility] °[phone] °Select option 1 for emergencies °  °Location: °[facility], [address], [place] °Clinic Hours: °No walk-ins accepted - call the day before to schedule an appointment. °Check in times are 9:30 am and 1:30 pm. °Services: °Simple extractions, temporary fillings, pulpectomy/pulp debridement, uncomplicated abscess drainage. °Payment Options: °PAYMENT IS DUE AT THE TIME OF SERVICE.  Fee is usually $100-200, additional surgical procedures (e.g. abscess drainage) may be extra. °Cash, checks, Visa/MasterCard accepted.  Can file Medicaid if patient is covered for dental - patient should call case worker to check. °No discount for [facility] patients. °Best way to get seen: °MUST call the day before and get onto the schedule. Can usually be seen the next 1-2 days. No walk-ins accepted. °  °  °[facility] °[phone] °   °Location: °[facility], [address], [place] °Clinic Hours: °M, W, Th, F 8am or 1:30pm, Tues 9a or 1:30 - first come/first served. °Services: °Simple extractions, temporary fillings, uncomplicated abscess drainage.  You do not need to be an [place] resident. °Payment Options: °PAYMENT IS DUE AT THE TIME OF SERVICE. °Dental insurance, otherwise sliding scale - bring proof of income or support. °Depending on income and treatment needed, cost is usually $50-200. °Best way to get seen: °Arrive early as it is first come/first served. °  °  °[facility] °[phone] °  °Location: °[address] °Clinic Hours: °Mon-Thu 8a-5p °Services: °Most basic dental services including extractions and fillings. °Payment Options: °PAYMENT IS DUE AT THE TIME OF SERVICE. °Sliding scale, up to 50% off - bring proof if income or support. °Medicaid with dental option accepted. °Best way to get seen: °Call to schedule an appointment, can usually be seen within 2 weeks OR they will try to see walk-ins - show up at 8a or 2p (you may have to wait). °  °  °[facility] °919-245-2435 °[place] RESIDENTS ONLY °  °Location: °[facility], [address], [place], [place] [address] °Clinic Hours: By appointment only. °Monday - Thursday 8am-5pm, Friday 8am-12pm °Services: Cleanings, fillings, extractions. °Payment Options: °PAYMENT IS DUE AT THE TIME OF SERVICE. °Cash, Visa or MasterCard. Sliding scale - $30 minimum per service. °Best way to get seen: °Come in to office, complete packet and make an appointment - need proof of income °or support monies for each household member and proof of [place] residence. °Usually takes about a month to get in. °  °  °[facility] °[phone] °  °Location: °[address]   [place] °Clinic Hours: Walk-in Urgent Care Dental Services are offered Monday-Friday  mornings only. °The numbers of emergencies accepted daily is limited to the number of °providers available. °Maximum 15 - Mondays, Wednesdays & Thursdays °Maximum 10 - Tuesdays & Fridays °Services: °You do not need to be a [place] resident to be seen for a dental emergency. °Emergencies are defined as pain, swelling, abnormal bleeding, or dental trauma. Walkins will receive x-rays if needed. °NOTE: Dental cleaning is not an emergency. °Payment Options: °PAYMENT IS DUE AT THE TIME OF SERVICE. °Minimum co-pay is $40.00 for uninsured patients. °Minimum co-pay is $3.00 for Medicaid with dental coverage. °Dental Insurance is accepted and must be presented at time of visit. °Medicare does not cover dental. °Forms of payment: Cash, credit card, checks. °Best way to get seen: °If not previously registered with the clinic, walk-in dental registration begins at 7:15 am and is on a first come/first serve basis. °If previously registered with the clinic, call to make an appointment. °  °  °[facility] °[phone] °[place] RESIDENTS ONLY °  °Location: °[address], [place], [place] °Clinic Hours: °Mon-Thu 10a-2p °Services: Extractions only! °Payment Options: °FREE (donations accepted) - bring proof of income or support °Best way to get seen: °Call and schedule an appointment OR come at 8am on the 1st Monday of every month (except for holidays) when it is first come/first served. °  °  °[facility] °[phone] °  °Location: °[address], [place] °Clinic Hours: °Friday mornings °Services, Payment Options, Best way to get seen: °Call for info °

## 2017-08-26 NOTE — ED Triage Notes (Signed)
Right jaw pain, broken teeth, swelling /pain, attempted to see dentist, referred here for ABX

## 2017-08-26 NOTE — ED Notes (Signed)
Pt c/o RT side dental pain, pt states broken tooth x2 years but getting worse. Denies fever

## 2018-07-25 IMAGING — CR DG CHEST 2V
2 series · 2 of 2 positions shown · non-contrast
Comparison: Chest radiograph 04/01/2016

CLINICAL DATA: Cough and congestion

EXAM:
CHEST  2 VIEW

[chest pa]
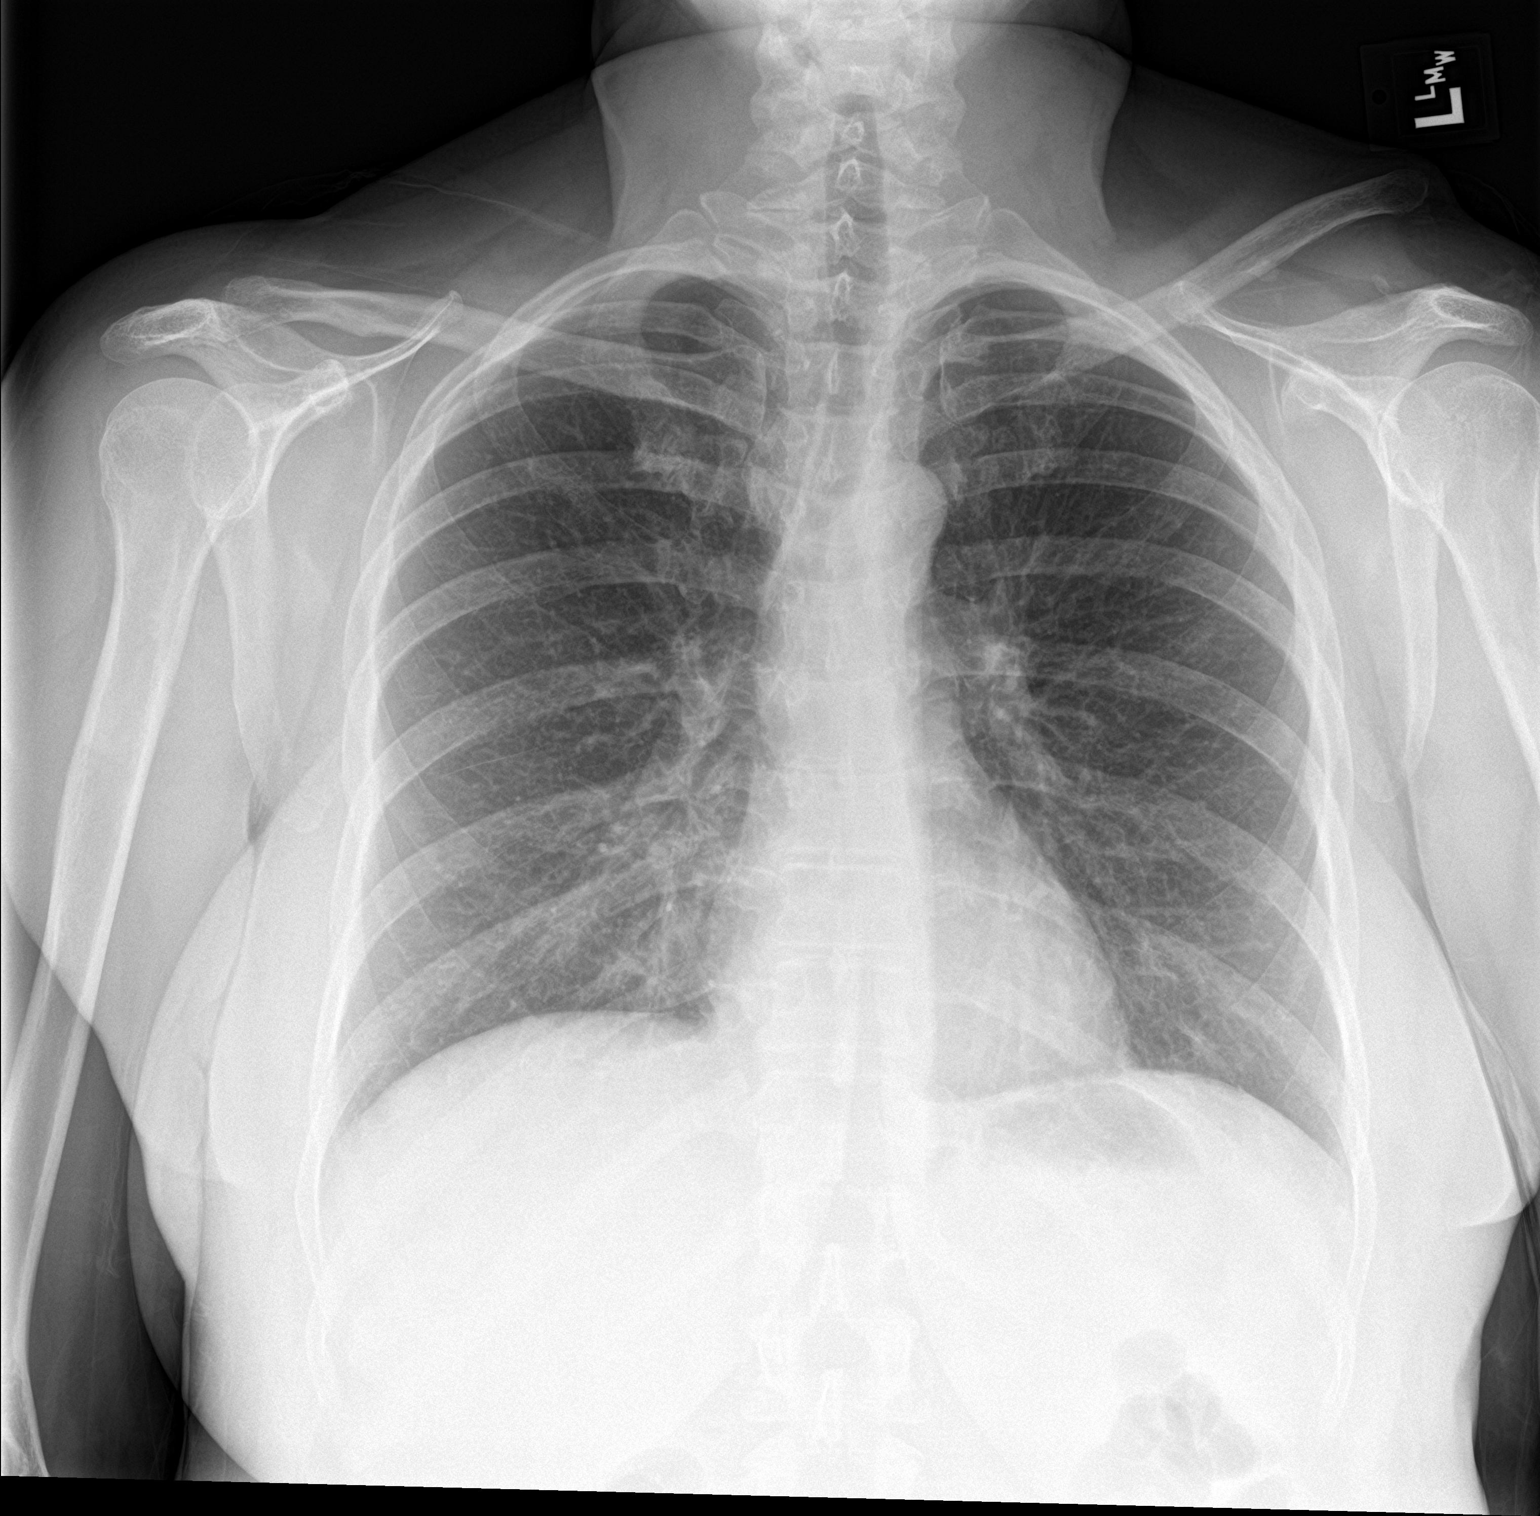

[chest lat]
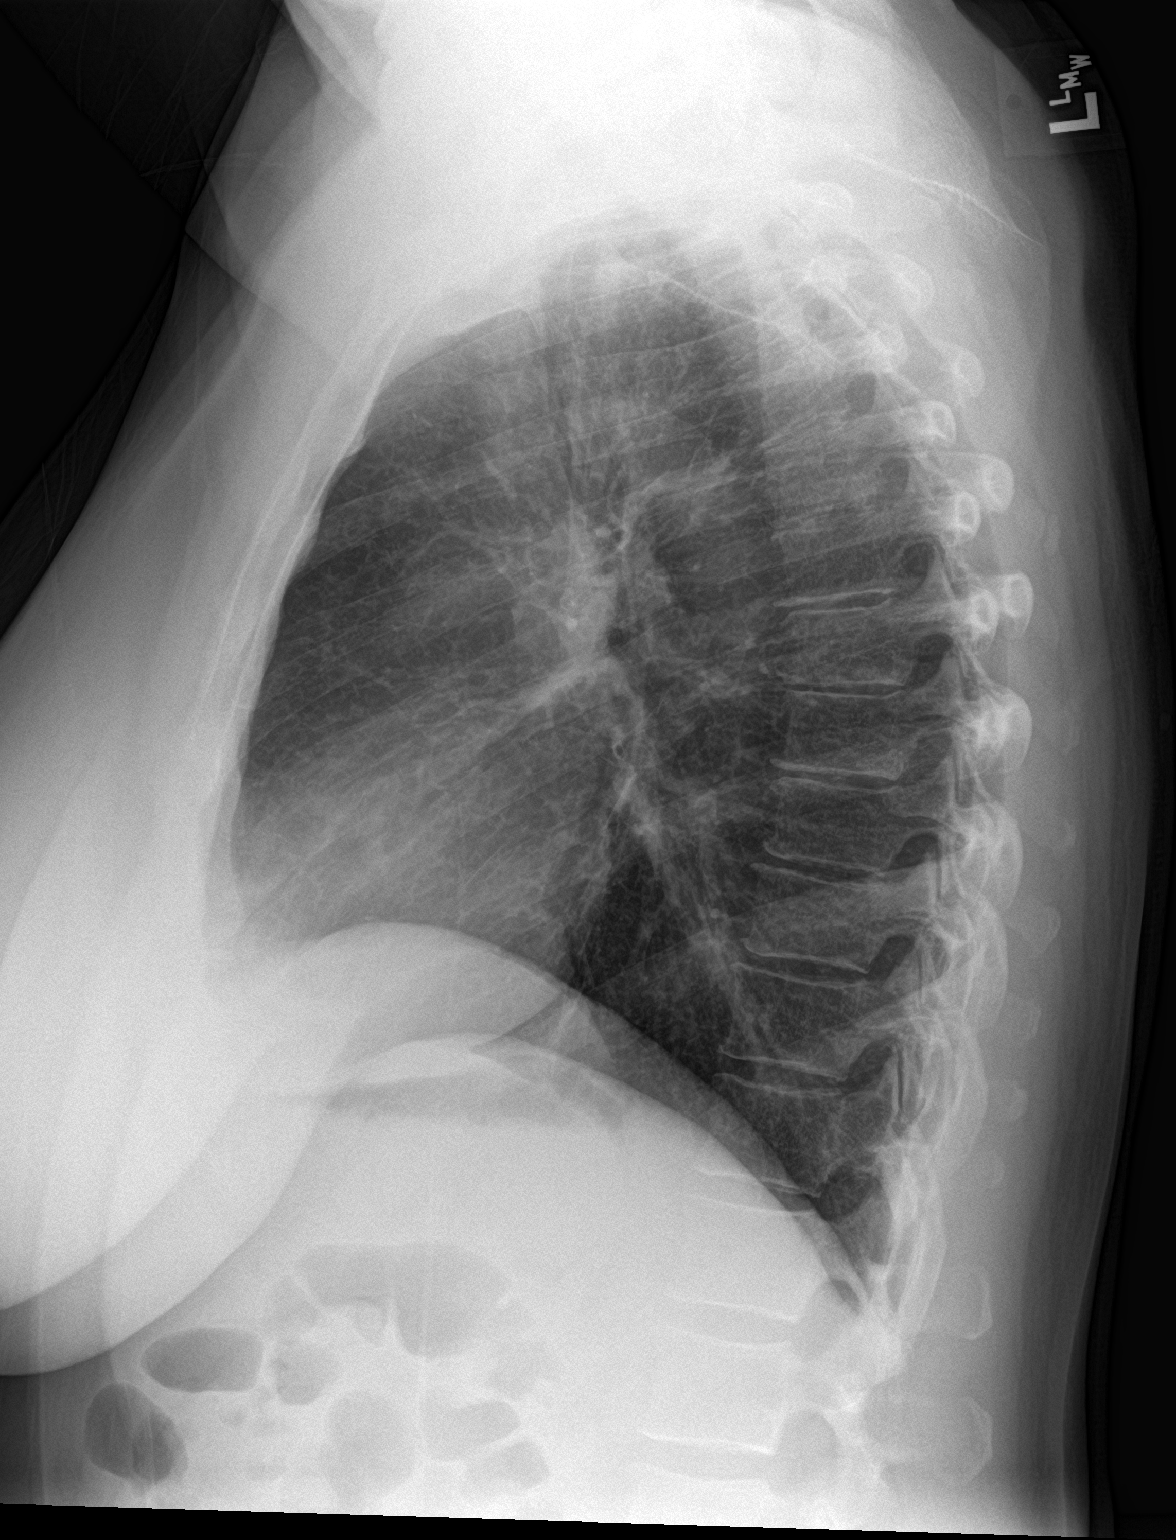

[2 of 2 positions shown; findings below may reference images not displayed]

FINDINGS: The heart size and mediastinal contours are within normal limits.
Both lungs are clear. The visualized skeletal structures are
unremarkable.
IMPRESSION: No active cardiopulmonary disease.

## 2018-09-10 ENCOUNTER — Emergency Department
Admission: EM | Admit: 2018-09-10 | Discharge: 2018-09-10 | Disposition: A | Discharge status: Home / Self Care | Payer: Self-pay | Attending: Emergency Medicine | Admitting: Emergency Medicine

## 2018-09-10 ENCOUNTER — Encounter: Payer: Self-pay | Admitting: Emergency Medicine

## 2018-09-10 ENCOUNTER — Other Ambulatory Visit: Payer: Self-pay

## 2018-09-10 DIAGNOSIS — Z79899 Other long term (current) drug therapy: Secondary | ICD-10-CM | POA: Insufficient documentation

## 2018-09-10 DIAGNOSIS — M273 Alveolitis of jaws: Secondary | ICD-10-CM | POA: Insufficient documentation

## 2018-09-10 MED ORDER — TRAMADOL HCL 50 MG PO TABS: 50.0000 mg | ORAL_TABLET | Freq: Four times a day (QID) | ORAL | 0 refills | Status: DC | PRN

## 2018-09-10 MED ORDER — HYDROCODONE-ACETAMINOPHEN 5-325 MG PO TABS: 1.0000 | ORAL_TABLET | ORAL | 0 refills | Status: DC | PRN

## 2018-09-10 MED ORDER — KETOROLAC TROMETHAMINE 60 MG/2ML IM SOLN
30.0000 mg | Freq: Once | INTRAMUSCULAR | Status: AC
  Administered 2018-09-10: 30 mg via INTRAMUSCULAR
  Filled 2018-09-10: qty 2

## 2018-09-10 MED ORDER — OXYCODONE-ACETAMINOPHEN 5-325 MG PO TABS
1.0000 | ORAL_TABLET | Freq: Once | ORAL | Status: AC
  Administered 2018-09-10: 1 via ORAL
  Filled 2018-09-10: qty 1

## 2018-09-10 MED ORDER — LIDOCAINE VISCOUS HCL 2 % MT SOLN
15.0000 mL | Freq: Once | OROMUCOSAL | Status: AC
  Administered 2018-09-10: 15 mL via OROMUCOSAL
  Filled 2018-09-10: qty 15

## 2018-09-10 MED ORDER — CEPHALEXIN 500 MG PO CAPS: 500.0000 mg | ORAL_CAPSULE | Freq: Two times a day (BID) | ORAL | 0 refills | Status: AC

## 2018-09-10 NOTE — ED Triage Notes (Signed)
Pt presents to ED with right sided dental pain. Pt reports she got a tooth pulled on Friday at [facility]. Pt states the past couple of days her pain has been unbearable. Pt tearful in triage.

## 2018-09-10 NOTE — ED Provider Notes (Signed)
[facility] Emergency Department Provider Note   First MD Initiated Contact with Patient 09/10/18 [phone]     (approximate)  I have reviewed the triage vital signs and the nursing notes.   HISTORY  Chief Complaint Dental Pain   HPI Stephanie Holland is a 52 y.o. female with below list of chronic medical conditions presents to the emergency department with pain at the site of dental extraction which was performed on Friday of last week at [facility].  Patient admits to 10 out of 10 pain.  Patient denies any fever no difficulty swallowing.   Past Medical History:  Diagnosis Date  . Bipolar 1 disorder, depressed ([place])   . Insomnia   . Manic depression ([place])     There are no active problems to display for this patient.   Past Surgical History:  Procedure Laterality Date  . APPENDECTOMY      Prior to Admission medications   Medication Sig Start Date End Date Taking? Authorizing Provider  albuterol (PROVENTIL HFA;VENTOLIN HFA) 108 (90 Base) MCG/ACT inhaler Inhale 2 puffs into the lungs every 4 (four) hours as needed for wheezing or shortness of breath. 04/03/17   Cuthriell, Charline Bills, PA-C  amoxicillin (AMOXIL) 500 MG capsule Take 1 capsule (500 mg total) by mouth 3 (three) times daily. 08/26/17   Sable Feil, PA-C  amoxicillin-clavulanate (AUGMENTIN) 875-125 MG tablet Take 1 tablet by mouth 2 (two) times daily. 04/03/17   Cuthriell, Charline Bills, PA-C  buPROPion (WELLBUTRIN SR) 150 MG 12 hr tablet Take 150 mg by mouth 2 (two) times daily.    [provider]  cetirizine (ZYRTEC) 10 MG tablet Take 1 tablet (10 mg total) by mouth daily. 04/03/17   Cuthriell, Charline Bills, PA-C  fluticasone (FLONASE) 50 MCG/ACT nasal spray Place 1 spray into both nostrils 2 (two) times daily. 04/03/17   Cuthriell, Charline Bills, PA-C  meloxicam (MOBIC) 15 MG tablet Take 1 tablet (15 mg total) by mouth daily. 08/06/16   Hagler, Jami L, PA-C  naproxen (NAPROSYN) 500 MG tablet  Take 1 tablet (500 mg total) by mouth 2 (two) times daily with a meal. 04/01/16   Hagler, Jami L, PA-C  naproxen (NAPROSYN) 500 MG tablet Take 1 tablet (500 mg total) by mouth 2 (two) times daily with a meal. 08/26/17   Sable Feil, PA-C  predniSONE (DELTASONE) 50 MG tablet Take 1 tablet (50 mg total) by mouth daily with breakfast. 04/03/17   Cuthriell, Charline Bills, PA-C  traZODone (DESYREL) 100 MG tablet Take 1 tablet (100 mg total) by mouth at bedtime. 05/31/13   Fransico Meadow, PA-C  traZODone (DESYREL) 50 MG tablet Take 50 mg by mouth at bedtime.    [provider]  ARIPiprazole (ABILIFY) 5 MG tablet Take 5 mg by mouth daily.  04/01/16  [provider]    Allergies No known drug allergies No family history on file.  Social History Social History   Tobacco Use  . Smoking status: Never Smoker  . Smokeless tobacco: Never Used  Substance Use Topics  . Alcohol use: Yes  . Drug use: No    Review of Systems Constitutional: No fever/chills Eyes: No visual changes. ENT: No sore throat.  Positive for dental pain Cardiovascular: Denies chest pain. Respiratory: Denies shortness of breath. Gastrointestinal: No abdominal pain.  No nausea, no vomiting.  No diarrhea.  No constipation. Genitourinary: Negative for dysuria. Musculoskeletal: Negative for neck pain.  Negative for back pain. Integumentary: Negative  for rash. Neurological: Negative for headaches, focal weakness or numbness.  ____________________________________________   PHYSICAL EXAM:  VITAL SIGNS: ED Triage Vitals  Enc Vitals Group     BP 09/10/18 0339 117/78     Pulse Rate 09/10/18 0339 75     Resp 09/10/18 0339 18     Temp 09/10/18 0339 98 F (36.7 C)     Temp src --      SpO2 09/10/18 0339 100 %     Weight 09/10/18 0337 81.6 kg (180 lb)     Height 09/10/18 0337 1.676 m (5\' 6" )     Head Circumference --      Peak Flow --      Pain Score 09/10/18 0336 10     Pain Loc --      Pain Edu? --       Excl. in [place]? --     Constitutional: Alert and oriented. Well appearing and in no acute distress. Eyes: Conjunctivae are normal. Head: Atraumatic. Mouth/Throat: Mucous membranes are moist.  No signs of infection noted at dental extraction site of posterior right mandibular molar however dry socket noted Neck: No stridor.   Cardiovascular: Normal rate, regular rhythm. Good peripheral circulation. Grossly normal heart sounds. Musculoskeletal: No lower extremity tenderness nor edema. No gross deformities of extremities. Neurologic:  Normal speech and language. No gross focal neurologic deficits are appreciated.  Skin:  Skin is warm, dry and intact. No rash noted.   Procedures   ____________________________________________   INITIAL IMPRESSION / ASSESSMENT AND PLAN / ED COURSE  As part of my medical decision making, I reviewed the following data within the electronic MEDICAL RECORD NUMBER  52 year old female presenting with above-stated history and physical exam consistent with dry socket.  Viscous lidocaine swish and spit given as well as Toradol IM shot.  Viscous lidocaine soaked gauze introduced into the socket. ____________________________________________  FINAL CLINICAL IMPRESSION(S) / ED DIAGNOSES  Final diagnoses:  Dry tooth socket     MEDICATIONS GIVEN DURING THIS VISIT:  Medications  ketorolac (TORADOL) injection 30 mg (has no administration in time range)  lidocaine (XYLOCAINE) 2 % viscous mouth solution 15 mL (has no administration in time range)  lidocaine (XYLOCAINE) 2 % viscous mouth solution 15 mL (has no administration in time range)     ED Discharge Orders    None       Note:  This document was prepared using Dragon voice recognition software and may include unintentional dictation errors.    Gregor Hams, MD 09/10/18 [phone]

## 2020-05-27 ENCOUNTER — Other Ambulatory Visit: Payer: Self-pay

## 2020-05-27 ENCOUNTER — Ambulatory Visit (HOSPITAL_COMMUNITY)
Admission: EM | Admit: 2020-05-27 | Discharge: 2020-05-27 | Disposition: A | Discharge status: Home / Self Care | Payer: No Payment, Other | Attending: Psychiatry | Admitting: Psychiatry

## 2020-05-27 DIAGNOSIS — F1490 Cocaine use, unspecified, uncomplicated: Secondary | ICD-10-CM | POA: Diagnosis not present

## 2020-05-27 DIAGNOSIS — F1010 Alcohol abuse, uncomplicated: Secondary | ICD-10-CM | POA: Insufficient documentation

## 2020-05-27 DIAGNOSIS — Z590 Homelessness: Secondary | ICD-10-CM | POA: Insufficient documentation

## 2020-05-27 DIAGNOSIS — F329 Major depressive disorder, single episode, unspecified: Secondary | ICD-10-CM | POA: Diagnosis not present

## 2020-05-27 NOTE — ED Notes (Signed)
Belongings in locker 31 

## 2020-05-27 NOTE — Discharge Instructions (Addendum)
Please follow up with list of referrals provided  Take all of your medications as prescribed by your mental healthcare provider.  Report any adverse effects and reactions from your medications to your outpatient provider promptly.  Do not engage in alcohol and or illegal drug use while on prescription medicines. Keep all scheduled appointments. This is to ensure that you are getting refills on time and to avoid any interruption in your medication.  If you are unable to keep an appointment call to reschedule.  Be sure to follow up with resources and follow ups given. In the event of worsening symptoms call the crisis hotline, 911, and or go to the nearest emergency department for appropriate evaluation and treatment of symptoms. Follow-up with your primary care provider for your medical issues, concerns and or health care needs.

## 2020-05-27 NOTE — ED Notes (Signed)
Patient's blood pressure was re-checked 129/97
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
## 2020-05-27 NOTE — ED Provider Notes (Signed)
Behavioral Health Medical Screening Exam  Stephanie Holland is a 54 y.o. female who presents to [facility] for walk-in assessment, patient is voluntary.  Patient reports chronic alcohol use disorder as well as recent relapse on crack cocaine.  Patient reports recent homelessness and desire to stop use of substance and alcohol.  Patient reports "I have got to stop using alcohol because my daughter is not going to let me see my grandbaby if I do not quit drinking."  Patient assessed by nurse practitioner.  Patient alert and oriented, answers appropriately.  Patient denies suicidal ideations.  Patient denies history of self-harm behaviors, denies history of suicide attempts.  Patient denies homicidal ideations.  Patient denies auditory visual hallucinations.  There is no evidence that the patient is responding to internal stimuli and she denies symptoms of paranoia.  Patient reports she has recently been residing in "the crack motel in [place]."  Patient denies access to weapons.  Patient reports she is employed off and on in [employer].  Patient reports chronic alcohol use times approximately 30 years and crack cocaine use times approximately 3 months.  Patient reports she is currently treated by primary care provider at [facility] for anxiety.  Patient offered support and encouragement.  Total Time spent with patient: 30 minutes  Psychiatric Specialty Exam  Presentation  General Appearance:Appropriate for Environment  Eye Contact:Good  Speech:Clear and Coherent;Normal Rate  Speech Volume:Normal  Handedness:Right   Mood and Affect  Mood:Depressed  Affect:Depressed;Congruent;Tearful   Thought Process  Thought Processes:Coherent;Goal Directed  Descriptions of Associations:Intact  Orientation:Full (Time, Place and Person)  Thought Content:WDL  Hallucinations:None  Ideas of Reference:None  Suicidal Thoughts:No  Homicidal Thoughts:No   Sensorium  Memory:Immediate  Good;Recent Good;Remote Good  Judgment:Fair  Insight:Fair   Executive Functions  Concentration:Fair  Attention Span:Good  [place]  Language:Good   Psychomotor Activity  Psychomotor Activity:Normal   Assets  Assets:Communication Skills;Desire for Improvement;Financial Resources/Insurance;Leisure Time;Intimacy;Physical Health;Social Support;Transportation   Sleep  Sleep:Good  Number of hours: 8   Physical Exam: Physical Exam Vitals and nursing note reviewed.  Constitutional:      Appearance: She is well-developed.  HENT:     Head: Normocephalic and atraumatic.  Eyes:     Conjunctiva/sclera: Conjunctivae normal.  Cardiovascular:     Rate and Rhythm: Normal rate and regular rhythm.  Pulmonary:     Effort: Pulmonary effort is normal.     Breath sounds: Normal breath sounds.  Abdominal:     Palpations: Abdomen is soft.  Musculoskeletal:     Cervical back: Neck supple.  Skin:    General: Skin is warm and dry.  Neurological:     Mental Status: She is alert and oriented to person, place, and time.  Psychiatric:        Attention and Perception: Attention and perception normal.        Mood and Affect: Affect normal. Mood is depressed.        Speech: Speech normal.        Behavior: Behavior normal. Behavior is cooperative.        Thought Content: Thought content normal.        Cognition and Memory: Cognition normal.        Judgment: Judgment normal.    Review of Systems  Constitutional: Negative.   HENT: Negative.   Eyes: Negative.   Respiratory: Negative.   Cardiovascular: Negative.   Gastrointestinal: Negative.   Genitourinary: Negative.   Musculoskeletal: Negative.   Skin: Negative.  Neurological: Negative.   Endo/Heme/Allergies: Negative.   Psychiatric/Behavioral: Positive for depression and substance abuse.   Blood pressure (!) 129/97, pulse 96, temperature 98.2 F (36.8 C), temperature source Oral, resp. rate 16,  height 5\' 2"  (1.575 m), weight (!) 209 lb (94.8 kg), SpO2 99 %. Body mass index is 38.23 kg/m.  Musculoskeletal: Strength & Muscle Tone: within normal limits Gait & Station: normal Patient leans: N/A   Recommendations: Patient reviewed with Dr. Hampton Abbot. Patient agrees with plan to follow-up with outpatient substance use treatment options provided.  Based on my evaluation the patient does not appear to have an emergency medical condition.  Emmaline Kluver, FNP 05/27/2020, 2:46 PM

## 2021-07-31 ENCOUNTER — Ambulatory Visit: Payer: Self-pay

## 2021-08-01 ENCOUNTER — Ambulatory Visit: Payer: Self-pay

## 2022-08-28 ENCOUNTER — Other Ambulatory Visit: Payer: Self-pay

## 2022-08-28 MED ORDER — TRAZODONE HCL 100 MG PO TABS
200.0000 mg | ORAL_TABLET | Freq: Every evening | ORAL | 1 refills | Status: DC | PRN
  Filled 2022-08-28: qty 60, 30d supply, fill #0
  Filled 2022-09-28: qty 60, 30d supply, fill #1

## 2022-08-28 MED ORDER — SERTRALINE HCL 100 MG PO TABS
100.0000 mg | ORAL_TABLET | Freq: Every day | ORAL | 1 refills | Status: DC
  Filled 2022-08-28: qty 30, 30d supply, fill #0
  Filled 2022-10-24: qty 30, 30d supply, fill #1

## 2022-08-28 MED ORDER — HYDROXYZINE HCL 25 MG PO TABS
ORAL_TABLET | ORAL | 1 refills | Status: DC
  Filled 2022-08-28: qty 90, 30d supply, fill #0

## 2022-08-31 ENCOUNTER — Other Ambulatory Visit: Payer: Self-pay

## 2022-09-28 ENCOUNTER — Other Ambulatory Visit: Payer: Self-pay

## 2022-09-28 MED ORDER — SERTRALINE HCL 50 MG PO TABS
ORAL_TABLET | ORAL | 1 refills | Status: DC
  Filled 2022-09-28: qty 30, 30d supply, fill #0
  Filled 2022-10-24: qty 30, 30d supply, fill #1

## 2022-09-28 MED ORDER — SERTRALINE HCL 100 MG PO TABS
100.0000 mg | ORAL_TABLET | Freq: Every day | ORAL | 1 refills | Status: DC
  Filled 2022-09-28: qty 30, 30d supply, fill #0
  Filled 2022-11-15: qty 30, 30d supply, fill #1

## 2022-10-24 ENCOUNTER — Other Ambulatory Visit: Payer: Self-pay

## 2022-10-24 MED ORDER — TRAZODONE HCL 100 MG PO TABS
200.0000 mg | ORAL_TABLET | Freq: Every evening | ORAL | 0 refills | Status: DC | PRN
  Filled 2022-10-24: qty 60, 30d supply, fill #0

## 2022-10-25 ENCOUNTER — Other Ambulatory Visit: Payer: Self-pay

## 2022-11-01 ENCOUNTER — Other Ambulatory Visit: Payer: Self-pay

## 2022-11-01 MED ORDER — SERTRALINE HCL 100 MG PO TABS: 100.0000 mg | ORAL_TABLET | Freq: Every day | ORAL | 0 refills | Status: DC

## 2022-11-01 MED ORDER — SERTRALINE HCL 50 MG PO TABS: 50.0000 mg | ORAL_TABLET | Freq: Every day | ORAL | 0 refills | Status: DC

## 2022-11-01 MED ORDER — TRAZODONE HCL 100 MG PO TABS
200.0000 mg | ORAL_TABLET | Freq: Every evening | ORAL | 0 refills | Status: DC | PRN
  Filled 2022-11-26: qty 60, 30d supply, fill #0

## 2022-11-05 ENCOUNTER — Other Ambulatory Visit: Payer: Self-pay

## 2022-11-15 ENCOUNTER — Encounter: Payer: Self-pay | Admitting: Gastroenterology

## 2022-11-15 ENCOUNTER — Other Ambulatory Visit: Payer: Self-pay

## 2022-11-26 ENCOUNTER — Other Ambulatory Visit: Payer: Self-pay

## 2022-11-30 ENCOUNTER — Other Ambulatory Visit: Payer: Self-pay

## 2022-11-30 MED ORDER — SERTRALINE HCL 100 MG PO TABS
100.0000 mg | ORAL_TABLET | Freq: Every day | ORAL | 1 refills | Status: DC
  Filled 2022-11-30 – 2022-12-28 (×2): qty 30, 30d supply, fill #0
  Filled 2023-05-03: qty 30, 30d supply, fill #1

## 2022-11-30 MED ORDER — SERTRALINE HCL 50 MG PO TABS
50.0000 mg | ORAL_TABLET | Freq: Every day | ORAL | 1 refills | Status: DC
  Filled 2022-11-30 – 2022-12-28 (×2): qty 30, 30d supply, fill #0

## 2022-11-30 MED ORDER — TRAZODONE HCL 100 MG PO TABS
100.0000 mg | ORAL_TABLET | Freq: Every evening | ORAL | 1 refills | Status: DC | PRN
  Filled 2022-11-30 – 2022-12-28 (×2): qty 30, 30d supply, fill #0
  Filled 2023-01-24: qty 30, 30d supply, fill #1

## 2022-12-07 ENCOUNTER — Other Ambulatory Visit: Payer: Self-pay

## 2022-12-09 ENCOUNTER — Other Ambulatory Visit: Payer: Self-pay

## 2022-12-28 ENCOUNTER — Other Ambulatory Visit: Payer: Self-pay

## 2022-12-28 ENCOUNTER — Other Ambulatory Visit (HOSPITAL_COMMUNITY): Payer: Self-pay

## 2023-01-24 ENCOUNTER — Ambulatory Visit
Admission: RE | Admit: 2023-01-24 | Discharge: 2023-01-24 | Disposition: A | Discharge status: Home / Self Care | Payer: Medicaid Other | Source: Ambulatory Visit | Attending: Primary Care | Admitting: Primary Care

## 2023-01-24 ENCOUNTER — Ambulatory Visit
Admission: RE | Admit: 2023-01-24 | Discharge: 2023-01-24 | Disposition: A | Discharge status: Home / Self Care | Payer: Medicaid Other | Attending: Primary Care | Admitting: Primary Care

## 2023-01-24 ENCOUNTER — Other Ambulatory Visit: Payer: Self-pay | Admitting: Primary Care

## 2023-01-24 DIAGNOSIS — M542 Cervicalgia: Secondary | ICD-10-CM | POA: Diagnosis not present

## 2023-01-29 ENCOUNTER — Other Ambulatory Visit: Payer: Self-pay

## 2023-01-29 MED ORDER — TRAZODONE HCL 100 MG PO TABS
150.0000 mg | ORAL_TABLET | Freq: Every evening | ORAL | 1 refills | Status: DC | PRN
  Filled 2023-01-29 – 2023-02-18 (×2): qty 45, 30d supply, fill #0
  Filled 2023-03-25: qty 45, 30d supply, fill #1

## 2023-02-08 ENCOUNTER — Other Ambulatory Visit: Payer: Self-pay

## 2023-02-15 ENCOUNTER — Other Ambulatory Visit: Payer: Self-pay

## 2023-02-18 ENCOUNTER — Other Ambulatory Visit: Payer: Self-pay

## 2023-02-26 ENCOUNTER — Ambulatory Visit (INDEPENDENT_AMBULATORY_CARE_PROVIDER_SITE_OTHER): Payer: Medicaid Other | Admitting: Gastroenterology

## 2023-02-26 ENCOUNTER — Encounter: Payer: Self-pay | Admitting: Gastroenterology

## 2023-02-26 VITALS — BP 130/85 | HR 82 | Temp 98.6°F | Ht 66.0 in | Wt 203.0 lb

## 2023-02-26 DIAGNOSIS — K580 Irritable bowel syndrome with diarrhea: Secondary | ICD-10-CM | POA: Diagnosis not present

## 2023-02-26 DIAGNOSIS — R197 Diarrhea, unspecified: Secondary | ICD-10-CM | POA: Diagnosis not present

## 2023-02-26 DIAGNOSIS — Z1211 Encounter for screening for malignant neoplasm of colon: Secondary | ICD-10-CM

## 2023-02-26 MED ORDER — NA SULFATE-K SULFATE-MG SULF 17.5-3.13-1.6 GM/177ML PO SOLN: 1.0000 | Freq: Once | ORAL | 0 refills | Status: AC

## 2023-02-26 MED ORDER — DICYCLOMINE HCL 20 MG PO TABS: 20.0000 mg | ORAL_TABLET | Freq: Three times a day (TID) | ORAL | 5 refills | Status: DC

## 2023-02-26 NOTE — Addendum Note (Signed)
Addended by: Roena Malady on: 02/26/2023 04:40 PM   Modules accepted: Orders

## 2023-02-26 NOTE — Progress Notes (Signed)
Gastroenterology Consultation  Referring Provider:     Sandrea Hughs, NP Primary Care Physician:  Sandrea Hughs, NP Primary Gastroenterologist:  Dr. Servando Snare     Reason for Consultation:     Irritable bowel syndrome        HPI:   Gracious Colleena Kurtenbach is a 57 y.o. y/o female referred for consultation & management of irritable bowel syndrome by Dr. Sandrea Hughs, NP.  This patient comes in today with a history of irritable bowel syndrome for 20 years.  The patient's symptoms include chronic diarrhea with abdominal discomfort and bloating.  It also appears that the patient is in need of a screening colonoscopy.  The patient reports that she has been exercising recently and has lost some weight but is not having any unexplained weight loss.  Patient also reports that she gets a lot of abdominal pain usually in the upper abdomen.  She also feels that she is having a lot of gas and bloating.  The patient states that her diarrhea does wake her up from sleep at night.  There is no report of any black stools or bloody stools.  The patient has not had a colonoscopy in many years.  Past Medical History:  Diagnosis Date   Bipolar 1 disorder, depressed (HCC)    Insomnia    Manic depression (HCC)     Past Surgical History:  Procedure Laterality Date   APPENDECTOMY      Prior to Admission medications   Medication Sig Start Date End Date Taking? Authorizing Provider  albuterol (PROVENTIL HFA;VENTOLIN HFA) 108 (90 Base) MCG/ACT inhaler Inhale 2 puffs into the lungs every 4 (four) hours as needed for wheezing or shortness of breath. 04/03/17   Cuthriell, Delorise Royals, PA-C  amoxicillin (AMOXIL) 500 MG capsule Take 1 capsule (500 mg total) by mouth 3 (three) times daily. 08/26/17   Joni Reining, PA-C  amoxicillin-clavulanate (AUGMENTIN) 875-125 MG tablet Take 1 tablet by mouth 2 (two) times daily. 04/03/17   Cuthriell, Delorise Royals, PA-C  buPROPion (WELLBUTRIN SR) 150 MG 12 hr tablet Take 150 mg by mouth 2 (two)  times daily.    [provider]  cetirizine (ZYRTEC) 10 MG tablet Take 1 tablet (10 mg total) by mouth daily. 04/03/17   Cuthriell, Delorise Royals, PA-C  fluticasone (FLONASE) 50 MCG/ACT nasal spray Place 1 spray into both nostrils 2 (two) times daily. 04/03/17   Cuthriell, Delorise Royals, PA-C  HYDROcodone-acetaminophen (NORCO/VICODIN) 5-325 MG tablet Take 1 tablet by mouth every 4 (four) hours as needed for up to 12 doses for moderate pain. 09/10/18   Darci Current, MD  hydrOXYzine (ATARAX) 25 MG tablet Take 1 tablet (25 mg total) by mouth in the morning as needed and 1-2 tablets (25-50 mg total) at bedtime as needed. 08/28/22     meloxicam (MOBIC) 15 MG tablet Take 1 tablet (15 mg total) by mouth daily. 08/06/16   Hagler, Jami L, PA-C  naproxen (NAPROSYN) 500 MG tablet Take 1 tablet (500 mg total) by mouth 2 (two) times daily with a meal. 04/01/16   Hagler, Jami L, PA-C  naproxen (NAPROSYN) 500 MG tablet Take 1 tablet (500 mg total) by mouth 2 (two) times daily with a meal. 08/26/17   Joni Reining, PA-C  predniSONE (DELTASONE) 50 MG tablet Take 1 tablet (50 mg total) by mouth daily with breakfast. 04/03/17   Cuthriell, Delorise Royals, PA-C  sertraline (ZOLOFT) 100 MG tablet Take 1 tablet (100 mg total) by mouth daily.  08/28/22     sertraline (ZOLOFT) 100 MG tablet Take 1 tablet (100 mg total) by mouth daily. 09/28/22     sertraline (ZOLOFT) 100 MG tablet Take 1 tablet (100 mg total) by mouth daily with 50mg  tablet for total daily dose of 150mg . 11/01/22     sertraline (ZOLOFT) 100 MG tablet Take 1 tablet (100 mg total) by mouth daily. 11/30/22     sertraline (ZOLOFT) 50 MG tablet Take 1 tablet (50 mg total) by mouth daily with 100mg  tablet for total daily of 150mg . 11/01/22     sertraline (ZOLOFT) 50 MG tablet Take 1 tablet (50 mg total) by mouth daily (with the 100 mg for total dose of 150 mg a day) 11/30/22     traZODone (DESYREL) 100 MG tablet Take 1 tablet (100 mg total) by mouth at bedtime. 05/31/13   Elson Areas, PA-C  traZODone (DESYREL) 100 MG tablet Take 1.5 tablets (150 mg total) by mouth at bedtime as needed. 01/29/23     traZODone (DESYREL) 50 MG tablet Take 50 mg by mouth at bedtime.    [provider]    No family history on file.   Social History   Tobacco Use   Smoking status: Never   Smokeless tobacco: Never  Substance Use Topics   Alcohol use: Yes   Drug use: No    Allergies as of 02/26/2023   (No Known Allergies)    Review of Systems:    All systems reviewed and negative except where noted in HPI.   Physical Exam:  There were no vitals taken for this visit. No LMP recorded. (Menstrual status: IUD). General:   Alert,  Well-developed, well-nourished, pleasant and cooperative in NAD Head:  Normocephalic and atraumatic. Eyes:  Sclera clear, no icterus.   Conjunctiva pink. Ears:  Normal auditory acuity. Neck:  Supple; no masses or thyromegaly. Lungs:  Respirations even and unlabored.  Clear throughout to auscultation.   No wheezes, crackles, or rhonchi. No acute distress. Heart:  Regular rate and rhythm; no murmurs, clicks, rubs, or gallops. Abdomen:  Normal bowel sounds.  No bruits.  Soft, non-tender and non-distended without masses, hepatosplenomegaly or hernias noted.  No guarding or rebound tenderness.  Negative Carnett sign.   Rectal:  Deferred.  Pulses:  Normal pulses noted. Extremities:  No clubbing or edema.  No cyanosis. Neurologic:  Alert and oriented x3;  grossly normal neurologically. Skin:  Intact without significant lesions or rashes.  No jaundice. Lymph Nodes:  No significant cervical adenopathy. Psych:  Alert and cooperative. Normal mood and affect.  Imaging Studies: No results found.  Assessment and Plan:   Karri Shelbylynn Walczyk is a 57 y.o. y/o female who comes in today with a history of irritable bowel syndrome with diarrhea predominance and a report of bloating and abdominal discomfort.  The patient also states that the diarrhea is waking  her up from a sound sleep.  She states that it has been worse over the last few months.  She also has abdominal cramping.  The patient will be set up for a colonoscopy to rule out any other cause for her diarrhea other than the irritable bowel syndrome.  The patient has also been instructed on a low FODMAP diet.  Due to her cramping the patient has also been given a prescription for dicyclomine.  The patient has been explained the plan and agrees with it.    Midge Minium, MD. Clementeen Graham    Note: This dictation was prepared with  Dragon dictation along with smaller Lobbyistphrase technology. Any transcriptional errors that result from this process are unintentional.

## 2023-02-26 NOTE — Patient Instructions (Signed)
Take Imodium as needed for diarrhea.

## 2023-03-08 ENCOUNTER — Encounter: Payer: Self-pay | Admitting: Gastroenterology

## 2023-03-08 NOTE — Anesthesia Preprocedure Evaluation (Addendum)
Anesthesia Evaluation  Patient identified by MRN, date of birth, ID band Patient awake    Reviewed: Allergy & Precautions, H&P , NPO status , Patient's Chart, lab work & pertinent test results  Airway Mallampati: II  TM Distance: >3 FB Neck ROM: Full    Dental no notable dental hx.    Pulmonary neg pulmonary ROS   Pulmonary exam normal breath sounds clear to auscultation       Cardiovascular negative cardio ROS Normal cardiovascular exam Rhythm:Regular Rate:Normal  EKG 03-11-23 sinus rhythm, long QT   Neuro/Psych  PSYCHIATRIC DISORDERS   Bipolar Disorder   negative neurological ROS     GI/Hepatic negative GI ROS, Neg liver ROS,,,  Endo/Other  negative endocrine ROS    Renal/GU negative Renal ROS  negative genitourinary   Musculoskeletal negative musculoskeletal ROS (+)    Abdominal   Peds negative pediatric ROS (+)  Hematology negative hematology ROS (+)   Anesthesia Other Findings Bipolar 1 disorder,  depressed (HCC)  Insomnia Manic depression (HCC)  Alcohol abuse (pt in AA) IBS (irritable bowel syndrome) Hx crack cocaine/substance abuse   Has been sober x 8 months Hx chest tightness/pressure and dyspnea on exertion  in past few months, plans to see cardiologist has appointment first week June, encouraged patient to keep this appointment    Reproductive/Obstetrics negative OB ROS                             Anesthesia Physical Anesthesia Plan  ASA: 3  Anesthesia Plan: General   Post-op Pain Management:    Induction: Intravenous  PONV Risk Score and Plan:   Airway Management Planned: Natural Airway and Nasal Cannula  Additional Equipment:   Intra-op Plan:   Post-operative Plan:   Informed Consent: I have reviewed the patients History and Physical, chart, labs and discussed the procedure including the risks, benefits and alternatives for the proposed anesthesia with  the patient or authorized representative who has indicated his/her understanding and acceptance.     Dental Advisory Given  Plan Discussed with: Anesthesiologist, CRNA and Surgeon  Anesthesia Plan Comments: (Patient consented for risks of anesthesia including but not limited to:  - adverse reactions to medications - risk of airway placement if required - damage to eyes, teeth, lips or other oral mucosa - nerve damage due to positioning  - sore throat or hoarseness - Damage to heart, brain, nerves, lungs, other parts of body or loss of life  Patient voiced understanding.)       Anesthesia Quick Evaluation

## 2023-03-15 ENCOUNTER — Encounter
Admission: RE | Disposition: A | Discharge status: Home / Self Care | Payer: Self-pay | Source: Home / Self Care | Attending: Gastroenterology

## 2023-03-15 ENCOUNTER — Ambulatory Visit
Admission: RE | Admit: 2023-03-15 | Discharge: 2023-03-15 | Disposition: A | Discharge status: Home / Self Care | Payer: Medicaid Other | Attending: Gastroenterology | Admitting: Gastroenterology

## 2023-03-15 ENCOUNTER — Ambulatory Visit: Payer: Medicaid Other | Admitting: Anesthesiology

## 2023-03-15 ENCOUNTER — Encounter: Payer: Self-pay | Admitting: Gastroenterology

## 2023-03-15 ENCOUNTER — Other Ambulatory Visit: Payer: Self-pay

## 2023-03-15 DIAGNOSIS — Z1211 Encounter for screening for malignant neoplasm of colon: Secondary | ICD-10-CM

## 2023-03-15 DIAGNOSIS — Z79899 Other long term (current) drug therapy: Secondary | ICD-10-CM | POA: Insufficient documentation

## 2023-03-15 DIAGNOSIS — R197 Diarrhea, unspecified: Secondary | ICD-10-CM

## 2023-03-15 DIAGNOSIS — K580 Irritable bowel syndrome with diarrhea: Secondary | ICD-10-CM | POA: Insufficient documentation

## 2023-03-15 DIAGNOSIS — Z01818 Encounter for other preprocedural examination: Secondary | ICD-10-CM

## 2023-03-15 DIAGNOSIS — K640 First degree hemorrhoids: Secondary | ICD-10-CM | POA: Diagnosis not present

## 2023-03-15 DIAGNOSIS — K529 Noninfective gastroenteritis and colitis, unspecified: Secondary | ICD-10-CM | POA: Diagnosis present

## 2023-03-15 DIAGNOSIS — F319 Bipolar disorder, unspecified: Secondary | ICD-10-CM | POA: Insufficient documentation

## 2023-03-15 DIAGNOSIS — K5730 Diverticulosis of large intestine without perforation or abscess without bleeding: Secondary | ICD-10-CM | POA: Insufficient documentation

## 2023-03-15 HISTORY — DX: Irritable bowel syndrome, unspecified: K58.9

## 2023-03-15 HISTORY — PX: COLONOSCOPY WITH PROPOFOL: SHX5780

## 2023-03-15 HISTORY — DX: Alcohol abuse, uncomplicated: F10.10

## 2023-03-15 SURGERY — COLONOSCOPY WITH PROPOFOL
Anesthesia: General | Site: Rectum

## 2023-03-15 MED ORDER — SODIUM CHLORIDE 0.9 % IV SOLN: INTRAVENOUS | Status: DC

## 2023-03-15 MED ORDER — STERILE WATER FOR IRRIGATION IR SOLN
Status: DC | PRN
  Administered 2023-03-15: 150 mL

## 2023-03-15 MED ORDER — LIDOCAINE HCL (CARDIAC) PF 100 MG/5ML IV SOSY
PREFILLED_SYRINGE | INTRAVENOUS | Status: DC | PRN
  Administered 2023-03-15: 60 mg via INTRAVENOUS

## 2023-03-15 MED ORDER — PROPOFOL 10 MG/ML IV BOLUS
INTRAVENOUS | Status: DC | PRN
  Administered 2023-03-15 (×6): 50 mg via INTRAVENOUS

## 2023-03-15 MED ORDER — LACTATED RINGERS IV SOLN: INTRAVENOUS | Status: DC

## 2023-03-15 SURGICAL SUPPLY — 21 items

## 2023-03-15 NOTE — Transfer of Care (Signed)
Immediate Anesthesia Transfer of Care Note  Patient: Stephanie Holland  Procedure(s) Performed: COLONOSCOPY WITH PROPOFOL (Rectum)  Patient Location: PACU  Anesthesia Type: General  Level of Consciousness: awake, alert  and patient cooperative  Airway and Oxygen Therapy: Patient Spontanous Breathing and Patient connected to supplemental oxygen  Post-op Assessment: Post-op Vital signs reviewed, Patient's Cardiovascular Status Stable, Respiratory Function Stable, Patent Airway and No signs of Nausea or vomiting  Post-op Vital Signs: Reviewed and stable  Complications: No notable events documented.

## 2023-03-15 NOTE — H&P (Signed)
Midge Minium, MD [facility] [address] [place], [place] [address] [phone] Fax : [phone]  Primary Care Physician:  Sandrea Hughs, NP Primary Gastroenterologist:  Dr. Servando Snare  Pre-Procedure History & Physical: HPI:  Stephanie Holland is a 57 y.o. female is here for an colonoscopy.   Past Medical History:  Diagnosis Date   Alcohol abuse    Bipolar 1 disorder, depressed (HCC)    IBS (irritable bowel syndrome)    Insomnia    Manic depression (HCC)     Past Surgical History:  Procedure Laterality Date   APPENDECTOMY      Prior to Admission medications   Medication Sig Start Date End Date Taking? Authorizing Provider  cyclobenzaprine (FLEXERIL) 5 MG tablet Take 5 mg by mouth 3 (three) times daily as needed for muscle spasms.   Yes [provider]  dicyclomine (BENTYL) 20 MG tablet Take 1 tablet (20 mg total) by mouth 3 (three) times daily before meals. 02/26/23  Yes Midge Minium, MD  meloxicam (MOBIC) 7.5 MG tablet Take 7.5 mg by mouth daily.   Yes [provider]  sertraline (ZOLOFT) 100 MG tablet Take 1 tablet (100 mg total) by mouth daily. Patient taking differently: Take 200 mg by mouth daily. 11/30/22  Yes   traZODone (DESYREL) 100 MG tablet Take 1.5 tablets (150 mg total) by mouth at bedtime as needed. Patient taking differently: Take 200 mg by mouth at bedtime as needed. 01/29/23  Yes   HYDROcodone-acetaminophen (NORCO/VICODIN) 5-325 MG tablet Take 1 tablet by mouth every 4 (four) hours as needed for up to 12 doses for moderate pain. Patient not taking: Reported on 03/08/2023 09/10/18   Darci Current, MD    Allergies as of 02/26/2023   (No Known Allergies)    History reviewed. No pertinent family history.  Social History   Socioeconomic History   Marital status: Single    Spouse name: Not on file   Number of children: Not on file   Years of education: Not on file   Highest education level: Not on file  Occupational History   Not  on file  Tobacco Use   Smoking status: Never   Smokeless tobacco: Never  Substance and Sexual Activity   Alcohol use: Yes   Drug use: No   Sexual activity: Not on file  Other Topics Concern   Not on file  Social History Narrative   Not on file   Social Determinants of Health   Financial Resource Strain: Not on file  Food Insecurity: Not on file  Transportation Needs: Not on file  Physical Activity: Not on file  Stress: Not on file  Social Connections: Not on file  Intimate Partner Violence: Not on file    Review of Systems: See HPI, otherwise negative ROS  Physical Exam: BP 138/87   Pulse 80   Temp (!) 97.4 F (36.3 C) (Temporal)   Wt 89.8 kg   SpO2 97%   BMI 31.96 kg/m  General:   Alert,  pleasant and cooperative in NAD Head:  Normocephalic and atraumatic. Neck:  Supple; no masses or thyromegaly. Lungs:  Clear throughout to auscultation.    Heart:  Regular rate and rhythm. Abdomen:  Soft, nontender and nondistended. Normal bowel sounds, without guarding, and without rebound.   Neurologic:  Alert and  oriented x4;  grossly normal neurologically.  Impression/Plan: Stephanie Holland is here for an colonoscopy to be performed for diarrhea  Risks, benefits, limitations, and alternatives regarding  colonoscopy  have been reviewed with the patient.  Questions have been answered.  All parties agreeable.   Midge Minium, MD  03/15/2023, 8:45 AM

## 2023-03-15 NOTE — Anesthesia Postprocedure Evaluation (Signed)
Anesthesia Post Note  Patient: Stephanie Holland  Procedure(s) Performed: COLONOSCOPY WITH PROPOFOL (Rectum)  Anesthesia Type: General Anesthetic complications: no   No notable events documented.   Last Vitals:  Vitals:   03/15/23 0830 03/15/23 0937  BP: 138/87 95/60  Pulse: 80 77  Resp:  18  Temp: (!) 36.3 C (!) 36.3 C  SpO2: 97% 94%    Last Pain:  Vitals:   03/15/23 0830  TempSrc: Temporal                 Genia Del

## 2023-03-15 NOTE — Op Note (Signed)
[facility] Gastroenterology Patient Name: Stephanie Holland Procedure Date: 03/15/2023 9:15 AM MRN: [id number] Account #: [id number] Date of Birth: 27-May-1966 Admit Type: Outpatient Age: 57 Room: [facility] OR ROOM 01 Gender: Female Note Status: Finalized Instrument Name: [id number] Procedure:             Colonoscopy Indications:           Chronic diarrhea Providers:             Midge Minium MD, MD Referring MD:          Dorcas Mcmurray (Referring MD) Medicines:             Propofol per Anesthesia Complications:         No immediate complications. Procedure:             Pre-Anesthesia Assessment:                        - Prior to the procedure, a History and Physical was                         performed, and patient medications and allergies were                         reviewed. The patient's tolerance of previous                         anesthesia was also reviewed. The risks and benefits                         of the procedure and the sedation options and risks                         were discussed with the patient. All questions were                         answered, and informed consent was obtained. Prior                         Anticoagulants: The patient has taken no anticoagulant                         or antiplatelet agents. ASA Grade Assessment: II - A                         patient with mild systemic disease. After reviewing                         the risks and benefits, the patient was deemed in                         satisfactory condition to undergo the procedure.                        After obtaining informed consent, the colonoscope was                         passed under direct vision. Throughout the procedure,  the patient's blood pressure, pulse, and oxygen                         saturations were monitored continuously. The                         Colonoscope was introduced through the anus and                          advanced to the the terminal ileum. The colonoscopy                         was performed without difficulty. The patient                         tolerated the procedure well. The quality of the bowel                         preparation was excellent. Findings:      The perianal and digital rectal examinations were normal.      The terminal ileum appeared normal. Biopsies were taken with a cold       forceps for histology.      A few small-mouthed diverticula were found in the sigmoid colon.      Non-bleeding internal hemorrhoids were found during retroflexion. The       hemorrhoids were Grade I (internal hemorrhoids that do not prolapse).      Random biopsies were obtained with cold forceps for histology randomly       in the entire colon. Impression:            - The examined portion of the ileum was normal.                         Biopsied.                        - Diverticulosis in the sigmoid colon.                        - Non-bleeding internal hemorrhoids.                        - Random biopsies were obtained in the entire colon. Recommendation:        - Discharge patient to home.                        - Resume previous diet.                        - Continue present medications.                        - Await pathology results.                        - Repeat colonoscopy in 10 years for screening                         purposes. Procedure Code(s):     --- Professional ---  54098, Colonoscopy, flexible; with biopsy, single or                         multiple Diagnosis Code(s):     --- Professional ---                        K52.9, Noninfective gastroenteritis and colitis,                         unspecified CPT copyright 2022 American Medical Association. All rights reserved. The codes documented in this report are preliminary and upon coder review may  be revised to meet current compliance requirements. Midge Minium MD, MD 03/15/2023 9:36:19 AM This  report has been signed electronically. Number of Addenda: 0 Note Initiated On: 03/15/2023 9:15 AM Scope Withdrawal Time: 0 hours 6 minutes 33 seconds  Total Procedure Duration: 0 hours 9 minutes 22 seconds  Estimated Blood Loss:  Estimated blood loss: none.      [facility]

## 2023-03-18 ENCOUNTER — Encounter: Payer: Self-pay | Admitting: Gastroenterology

## 2023-03-27 ENCOUNTER — Other Ambulatory Visit: Payer: Self-pay

## 2023-03-28 ENCOUNTER — Encounter: Payer: Self-pay | Admitting: Gastroenterology

## 2023-03-29 ENCOUNTER — Other Ambulatory Visit: Payer: Self-pay

## 2023-04-04 ENCOUNTER — Encounter: Payer: Self-pay | Admitting: Cardiology

## 2023-04-04 ENCOUNTER — Ambulatory Visit: Payer: Medicaid Other | Attending: Cardiology | Admitting: Cardiology

## 2023-04-04 VITALS — BP 130/90 | HR 76 | Ht 66.0 in | Wt 201.8 lb

## 2023-04-04 DIAGNOSIS — R072 Precordial pain: Secondary | ICD-10-CM | POA: Diagnosis present

## 2023-04-04 DIAGNOSIS — R0609 Other forms of dyspnea: Secondary | ICD-10-CM | POA: Diagnosis present

## 2023-04-04 MED ORDER — METOPROLOL TARTRATE 100 MG PO TABS: 100.0000 mg | ORAL_TABLET | Freq: Once | ORAL | 0 refills | Status: DC

## 2023-04-04 NOTE — Patient Instructions (Signed)
Medication Instructions:   Your physician recommends that you continue on your current medications as directed. Please refer to the Current Medication list given to you today.  *If you need a refill on your cardiac medications before your next appointment, please call your pharmacy*   Lab Work:  Your physician recommends you go to the medical mall for labs - BMP  If you have labs (blood work) drawn today and your tests are completely normal, you will receive your results only by: MyChart Message (if you have MyChart) OR A paper copy in the mail If you have any lab test that is abnormal or we need to change your treatment, we will call you to review the results.   Testing/Procedures:  Your physician has requested that you have an echocardiogram. Echocardiography is a painless test that uses sound waves to create images of your heart. It provides your doctor with information about the size and shape of your heart and how well your heart's chambers and valves are working. This procedure takes approximately one hour. There are no restrictions for this procedure. Please do NOT wear cologne, perfume, aftershave, or lotions (deodorant is allowed). Please arrive 15 minutes prior to your appointment time.    Your cardiac CT will be scheduled at:  [facility] [address] [place], [place] [address] [phone]  OR   [facility] [address] [place], [place] [address] [phone]   If scheduled at [facility] or [facility], please arrive 15 mins early for check-in and test prep.   Please follow these instructions carefully (unless otherwise directed):  Hold all erectile dysfunction medications at least 3 days (72 hrs) prior to test. (Ie viagra, cialis, sildenafil, tadalafil, etc) We will administer nitroglycerin during this exam.   On the Night Before the  Test: Be sure to Drink plenty of water. Do not consume any caffeinated/decaffeinated beverages or chocolate 12 hours prior to your test. Do not take any antihistamines 12 hours prior to your test.  On the Day of the Test: Drink plenty of water until 1 hour prior to the test. Do not eat any food 1 hour prior to test. You may take your regular medications prior to the test.  Take metoprolol (Lopressor) two hours prior to test. FEMALES- please wear underwire-free bra if available, avoid dresses & tight clothing       After the Test: Drink plenty of water. After receiving IV contrast, you may experience a mild flushed feeling. This is normal. On occasion, you may experience a mild rash up to 24 hours after the test. This is not dangerous. If this occurs, you can take Benadryl 25 mg and increase your fluid intake. If you experience trouble breathing, this can be serious. If it is severe call 911 IMMEDIATELY. If it is mild, please call our office.  For scheduling needs, including cancellations and rescheduling, please call [place], [phone].   Follow-Up: At [facility], you and your health needs are our priority.  As part of our continuing mission to provide you with exceptional heart care, we have created designated Provider Care Teams.  These Care Teams include your primary Cardiologist (physician) and Advanced Practice Providers (APPs -  Physician Assistants and Nurse Practitioners) who all work together to provide you with the care you need, when you need it.  We recommend signing up for the patient portal called "MyChart".  Sign up information is provided on  this After Visit Summary.  MyChart is used to connect with patients for Virtual Visits (Telemedicine).  Patients are able to view lab/test results, encounter notes, upcoming appointments, etc.  Non-urgent messages can be sent to your provider as well.   To learn more about what you can do with MyChart, go to  [email or web address].    Your next appointment:   6 - 8 week(s)  Provider:   You may see Debbe Odea, MD or one of the following Advanced Practice Providers on your designated Care Team:   Nicolasa Ducking, NP Eula Listen, PA-C Cadence Fransico Michael, PA-C Charlsie Quest, NP

## 2023-04-04 NOTE — Progress Notes (Signed)
Cardiology Office Note:    Date:  04/04/2023   ID:  Stephanie Holland, DOB [birth year], MRN [id number]  PCP:  Sandrea Hughs, NP   [facility] Providers Cardiologist:  Debbe Odea, MD     Referring MD: Sandrea Hughs, NP   Chief Complaint  Patient presents with   New Patient (Initial Visit)    Patient reports she is in drug/alcohol recovery.  Has history of ED visits for chest pain radiating down left arm.  Still having left side chest pain with SOBr.    History of Present Illness:    Stephanie Holland is a 57 y.o. female with a hx of alcohol abuse, former cocaine use x 20+ years, depression who presents due to chest pain.  Patient states having shortness of breath with exertion over the past 9 months.  Also endorses occasional nonexertional chest pain.  Denies any history of heart disease.  States being sober with no alcohol use for about 9 months now.  She is working on losing weight by eating healthier and exercising.  Denies palpitations, edema.  Occasionally has elevated blood pressures when she is anxious.  Past Medical History:  Diagnosis Date   Alcohol abuse    Bipolar 1 disorder, depressed (HCC)    IBS (irritable bowel syndrome)    Insomnia    Manic depression (HCC)     Past Surgical History:  Procedure Laterality Date   APPENDECTOMY     COLONOSCOPY WITH PROPOFOL N/A 03/15/2023   Procedure: COLONOSCOPY WITH PROPOFOL;  Surgeon: Midge Minium, MD;  Location: [facility];  Service: Endoscopy;  Laterality: N/A;    Current Medications: Current Meds  Medication Sig   meloxicam (MOBIC) 7.5 MG tablet Take 7.5 mg by mouth daily.   metoprolol tartrate (LOPRESSOR) 100 MG tablet Take 1 tablet (100 mg total) by mouth once for 1 dose. TWO HOURS PRIOR TO CARDIAC CTA   sertraline (ZOLOFT) 100 MG tablet Take 1 tablet (100 mg total) by mouth daily. (Patient taking differently: Take 200 mg by mouth daily.)   traZODone (DESYREL) 100 MG tablet Take 1.5 tablets (150 mg  total) by mouth at bedtime as needed. (Patient taking differently: Take 200 mg by mouth at bedtime as needed.)     Allergies:   Patient has no known allergies.   Social History   Socioeconomic History   Marital status: Single    Spouse name: Not on file   Number of children: Not on file   Years of education: Not on file   Highest education level: Not on file  Occupational History   Not on file  Tobacco Use   Smoking status: Never   Smokeless tobacco: Never  Vaping Use   Vaping Use: Never used  Substance and Sexual Activity   Alcohol use: Not Currently    Comment: quit 07/19/2022   Drug use: Not Currently    Types: Cocaine, Methamphetamines, "Crack" cocaine    Comment: quit 07/19/2022   Sexual activity: Not on file  Other Topics Concern   Not on file  Social History Narrative   Not on file   Social Determinants of Health   Financial Resource Strain: Not on file  Food Insecurity: Not on file  Transportation Needs: Not on file  Physical Activity: Not on file  Stress: Not on file  Social Connections: Not on file     Family History: The patient's family history includes Heart disease in her father and mother.  ROS:   Please see the  history of present illness.     All other systems reviewed and are negative.  EKGs/Labs/Other Studies Reviewed:    The following studies were reviewed today:   EKG:  EKG is  ordered today.  The ekg ordered today demonstrates normal sinus rhythm, normal ECG  Recent Labs: No results found for requested labs within last 365 days.  Recent Lipid Panel No results found for: "CHOL", "TRIG", "HDL", "CHOLHDL", "VLDL", "LDLCALC", "LDLDIRECT"   Risk Assessment/Calculations:     HYPERTENSION CONTROL Vitals:   04/04/23 0809 04/04/23 0812  BP: 136/84 (!) 130/90    The patient's blood pressure is elevated above target today.  In order to address the patient's elevated BP: Blood pressure will be monitored at home to determine if medication  changes need to be made.          Physical Exam:    VS:  BP (!) 130/90 (BP Location: Right Arm, Patient Position: Sitting, Cuff Size: Large)   Pulse 76   Ht 5\' 6"  (1.676 m)   Wt 201 lb 12.8 oz (91.5 kg)   SpO2 98%   BMI 32.57 kg/m     Wt Readings from Last 3 Encounters:  04/04/23 201 lb 12.8 oz (91.5 kg)  03/15/23 198 lb (89.8 kg)  02/26/23 203 lb (92.1 kg)     GEN:  Well nourished, well developed in no acute distress HEENT: Normal NECK: No JVD; No carotid bruits CARDIAC: RRR, no murmurs, rubs, gallops RESPIRATORY:  Clear to auscultation without rales, wheezing or rhonchi  ABDOMEN: Soft, non-tender, non-distended MUSCULOSKELETAL:  No edema; No deformity  SKIN: Warm and dry NEUROLOGIC:  Alert and oriented x 3 PSYCHIATRIC:  Normal affect   ASSESSMENT:    1. Precordial pain   2. Dyspnea on exertion    PLAN:    In order of problems listed above:  Chest pain, risk factors prior cocaine use.  Get echo, get coronary CT. Dyspnea on exertion, anginal equivalent, although could be secondary to deconditioning.  Workup with echo and coronary CT as above.  Follow-up after cardiac testing.  Patient will be reassured if testing is normal.      Medication Adjustments/Labs and Tests Ordered: Current medicines are reviewed at length with the patient today.  Concerns regarding medicines are outlined above.  Orders Placed This Encounter  Procedures   CT CORONARY MORPH W/CTA COR W/SCORE W/CA W/CM &/OR WO/CM   Basic Metabolic Panel (BMET)   EKG 12-Lead   ECHOCARDIOGRAM COMPLETE   Meds ordered this encounter  Medications   metoprolol tartrate (LOPRESSOR) 100 MG tablet    Sig: Take 1 tablet (100 mg total) by mouth once for 1 dose. TWO HOURS PRIOR TO CARDIAC CTA    Dispense:  1 tablet    Refill:  0    Patient Instructions  Medication Instructions:   Your physician recommends that you continue on your current medications as directed. Please refer to the Current Medication  list given to you today.  *If you need a refill on your cardiac medications before your next appointment, please call your pharmacy*   Lab Work:  Your physician recommends you go to the medical mall for labs - BMP  If you have labs (blood work) drawn today and your tests are completely normal, you will receive your results only by: MyChart Message (if you have MyChart) OR A paper copy in the mail If you have any lab test that is abnormal or we need to change your treatment, we will call  you to review the results.   Testing/Procedures:  Your physician has requested that you have an echocardiogram. Echocardiography is a painless test that uses sound waves to create images of your heart. It provides your doctor with information about the size and shape of your heart and how well your heart's chambers and valves are working. This procedure takes approximately one hour. There are no restrictions for this procedure. Please do NOT wear cologne, perfume, aftershave, or lotions (deodorant is allowed). Please arrive 15 minutes prior to your appointment time.    Your cardiac CT will be scheduled at:  [facility] [address] [place], [place] [address] [phone]  OR   [facility] [address] [place], [place] [address] [phone]   If scheduled at [facility] or [facility], please arrive 15 mins early for check-in and test prep.   Please follow these instructions carefully (unless otherwise directed):  Hold all erectile dysfunction medications at least 3 days (72 hrs) prior to test. (Ie viagra, cialis, sildenafil, tadalafil, etc) We will administer nitroglycerin during this exam.   On the Night Before the Test: Be sure to Drink plenty of water. Do not consume any caffeinated/decaffeinated beverages or chocolate 12 hours prior to your test. Do not take any  antihistamines 12 hours prior to your test.  On the Day of the Test: Drink plenty of water until 1 hour prior to the test. Do not eat any food 1 hour prior to test. You may take your regular medications prior to the test.  Take metoprolol (Lopressor) two hours prior to test. FEMALES- please wear underwire-free bra if available, avoid dresses & tight clothing       After the Test: Drink plenty of water. After receiving IV contrast, you may experience a mild flushed feeling. This is normal. On occasion, you may experience a mild rash up to 24 hours after the test. This is not dangerous. If this occurs, you can take Benadryl 25 mg and increase your fluid intake. If you experience trouble breathing, this can be serious. If it is severe call 911 IMMEDIATELY. If it is mild, please call our office.  For scheduling needs, including cancellations and rescheduling, please call [place], [phone].   Follow-Up: At [facility], you and your health needs are our priority.  As part of our continuing mission to provide you with exceptional heart care, we have created designated Provider Care Teams.  These Care Teams include your primary Cardiologist (physician) and Advanced Practice Providers (APPs -  Physician Assistants and Nurse Practitioners) who all work together to provide you with the care you need, when you need it.  We recommend signing up for the patient portal called "MyChart".  Sign up information is provided on this After Visit Summary.  MyChart is used to connect with patients for Virtual Visits (Telemedicine).  Patients are able to view lab/test results, encounter notes, upcoming appointments, etc.  Non-urgent messages can be sent to your provider as well.   To learn more about what you can do with MyChart, go to [email or web address].    Your next appointment:   6 - 8 week(s)  Provider:   You may see Debbe Odea, MD or one of the following Advanced Practice  Providers on your designated Care Team:   Nicolasa Ducking, NP Eula Listen, PA-C Cadence Fransico Michael, PA-C Charlsie Quest, NP   Signed, Debbe Odea, MD  04/04/2023 9:14 AM  [facility]

## 2023-04-08 ENCOUNTER — Other Ambulatory Visit
Admission: RE | Admit: 2023-04-08 | Discharge: 2023-04-08 | Disposition: A | Discharge status: Home / Self Care | Payer: Medicaid Other | Attending: Cardiology | Admitting: Cardiology

## 2023-04-08 DIAGNOSIS — R072 Precordial pain: Secondary | ICD-10-CM | POA: Insufficient documentation

## 2023-04-08 LAB — BASIC METABOLIC PANEL
Anion gap: 11 (ref 5–15)
BUN: 24 mg/dL — ABNORMAL HIGH (ref 6–20)
CO2: 23 mmol/L (ref 22–32)
Calcium: 9.2 mg/dL (ref 8.9–10.3)
Chloride: 103 mmol/L (ref 98–111)
Creatinine, Ser: 0.79 mg/dL (ref 0.44–1.00)
GFR, Estimated: >60 mL/min (ref >60)
Glucose, Bld: 97 mg/dL (ref 70–99)
Potassium: 4.1 mmol/L (ref 3.5–5.1)
Sodium: 137 mmol/L (ref 135–145)

## 2023-04-09 ENCOUNTER — Ambulatory Visit: Payer: Medicaid Other

## 2023-04-10 ENCOUNTER — Telehealth (HOSPITAL_COMMUNITY): Payer: Self-pay | Admitting: Emergency Medicine

## 2023-04-10 NOTE — Telephone Encounter (Signed)
Reaching out to patient to offer assistance regarding upcoming cardiac imaging study; pt verbalizes understanding of appt date/time, parking situation and where to check in, pre-test NPO status and medications ordered, and verified current allergies; name and call back number provided for further questions should they arise Edris Friedt RN Navigator Cardiac Imaging [facility] [phone] office [phone] cell 

## 2023-04-11 ENCOUNTER — Ambulatory Visit (HOSPITAL_COMMUNITY)
Admission: RE | Admit: 2023-04-11 | Discharge: 2023-04-11 | Disposition: A | Discharge status: Home / Self Care | Payer: Medicaid Other | Source: Ambulatory Visit | Attending: Cardiology | Admitting: Cardiology

## 2023-04-11 DIAGNOSIS — R072 Precordial pain: Secondary | ICD-10-CM

## 2023-04-11 MED ORDER — IOHEXOL 350 MG/ML SOLN
100.0000 mL | Freq: Once | INTRAVENOUS | Status: AC | PRN
  Administered 2023-04-11: 100 mL via INTRAVENOUS

## 2023-04-11 MED ORDER — NITROGLYCERIN 0.4 MG SL SUBL
SUBLINGUAL_TABLET | SUBLINGUAL | Status: AC
  Filled 2023-04-11: qty 2

## 2023-04-11 MED ORDER — NITROGLYCERIN 0.4 MG SL SUBL
0.8000 mg | SUBLINGUAL_TABLET | Freq: Once | SUBLINGUAL | Status: AC
  Administered 2023-04-11: 0.8 mg via SUBLINGUAL

## 2023-04-15 ENCOUNTER — Other Ambulatory Visit: Payer: Self-pay | Admitting: Orthopedic Surgery

## 2023-04-15 ENCOUNTER — Ambulatory Visit: Payer: Medicaid Other | Attending: Cardiology

## 2023-04-15 DIAGNOSIS — R072 Precordial pain: Secondary | ICD-10-CM

## 2023-04-15 LAB — ECHOCARDIOGRAM COMPLETE
Area-P 1/2: 4.39 cm2
S' Lateral: 2.7 cm

## 2023-04-16 ENCOUNTER — Encounter: Payer: Self-pay | Admitting: Orthopedic Surgery

## 2023-04-18 NOTE — Anesthesia Preprocedure Evaluation (Addendum)
Anesthesia Evaluation  Patient identified by MRN, date of birth, ID band Patient awake    Reviewed: Allergy & Precautions, H&P , NPO status , Patient's Chart, lab work & pertinent test results  Airway Mallampati: III  TM Distance: >3 FB Neck ROM: Full    Dental no notable dental hx.    Pulmonary neg pulmonary ROS   Pulmonary exam normal breath sounds clear to auscultation       Cardiovascular negative cardio ROS Normal cardiovascular exam Rhythm:Regular Rate:Normal  Echo 04-15-23 Ef 60-65%, cannot exclude small PFO  CT angiography 04-17-23 zero calcium score  EKG 03-11-23 sinus rhythm, long QT     Neuro/Psych  PSYCHIATRIC DISORDERS Anxiety  Bipolar Disorder   negative neurological ROS     GI/Hepatic negative GI ROS, Neg liver ROS,,,  Endo/Other  negative endocrine ROS    Renal/GU negative Renal ROS  negative genitourinary   Musculoskeletal negative musculoskeletal ROS (+)    Abdominal   Peds negative pediatric ROS (+)  Hematology negative hematology ROS (+)   Anesthesia Other Findings Bipolar 1 disorder,  depressed  Insomnia Manic depression (HCC)       Alcohol abuse (pt in AA) IBS (irritable bowel syndrome) Hx crack cocaine/substance abuse           Has been sober x 8 months Hx chest tightness/pressure and dyspnea on exertion  in past few months, saw cardiologist, echo and CT angiography good   Reproductive/Obstetrics negative OB ROS                             Anesthesia Physical Anesthesia Plan  ASA: 2  Anesthesia Plan: General   Post-op Pain Management:    Induction: Intravenous  PONV Risk Score and Plan:   Airway Management Planned: LMA  Additional Equipment:   Intra-op Plan:   Post-operative Plan: Extubation in OR  Informed Consent: I have reviewed the patients History and Physical, chart, labs and discussed the procedure including the risks, benefits and  alternatives for the proposed anesthesia with the patient or authorized representative who has indicated his/her understanding and acceptance.     Dental Advisory Given  Plan Discussed with: Anesthesiologist, CRNA and Surgeon  Anesthesia Plan Comments: (Patient consented for risks of anesthesia including but not limited to:  - adverse reactions to medications - damage to eyes, teeth, lips or other oral mucosa - nerve damage due to positioning  - sore throat or hoarseness - Damage to heart, brain, nerves, lungs, other parts of body or loss of life  Patient voiced understanding.)        Anesthesia Quick Evaluation

## 2023-04-23 ENCOUNTER — Other Ambulatory Visit: Payer: Self-pay

## 2023-04-23 ENCOUNTER — Ambulatory Visit
Admission: RE | Admit: 2023-04-23 | Discharge: 2023-04-23 | Disposition: A | Discharge status: Home / Self Care | Payer: Medicaid Other | Attending: Orthopedic Surgery | Admitting: Orthopedic Surgery

## 2023-04-23 ENCOUNTER — Ambulatory Visit: Payer: Medicaid Other | Admitting: Anesthesiology

## 2023-04-23 ENCOUNTER — Encounter: Payer: Self-pay | Admitting: Orthopedic Surgery

## 2023-04-23 ENCOUNTER — Encounter
Admission: RE | Disposition: A | Discharge status: Home / Self Care | Payer: Self-pay | Source: Home / Self Care | Attending: Orthopedic Surgery

## 2023-04-23 DIAGNOSIS — F419 Anxiety disorder, unspecified: Secondary | ICD-10-CM | POA: Insufficient documentation

## 2023-04-23 DIAGNOSIS — G5603 Carpal tunnel syndrome, bilateral upper limbs: Secondary | ICD-10-CM | POA: Insufficient documentation

## 2023-04-23 DIAGNOSIS — F319 Bipolar disorder, unspecified: Secondary | ICD-10-CM | POA: Diagnosis not present

## 2023-04-23 DIAGNOSIS — K589 Irritable bowel syndrome without diarrhea: Secondary | ICD-10-CM | POA: Insufficient documentation

## 2023-04-23 HISTORY — DX: Motion sickness, initial encounter: T75.3XXA

## 2023-04-23 HISTORY — PX: CARPAL TUNNEL RELEASE: SHX101

## 2023-04-23 SURGERY — CARPAL TUNNEL RELEASE
Anesthesia: General | Site: Wrist | Laterality: Right

## 2023-04-23 MED ORDER — ONDANSETRON HCL 4 MG/2ML IJ SOLN: 4.0000 mg | Freq: Four times a day (QID) | INTRAMUSCULAR | Status: DC | PRN

## 2023-04-23 MED ORDER — CEFAZOLIN SODIUM-DEXTROSE 2-4 GM/100ML-% IV SOLN
2.0000 g | INTRAVENOUS | Status: AC
  Administered 2023-04-23: 2 g via INTRAVENOUS

## 2023-04-23 MED ORDER — HYDROMORPHONE HCL 1 MG/ML IJ SOLN: 0.5000 mg | INTRAMUSCULAR | Status: DC | PRN

## 2023-04-23 MED ORDER — MIDAZOLAM HCL 2 MG/2ML IJ SOLN
INTRAMUSCULAR | Status: DC | PRN
  Administered 2023-04-23: 2 mg via INTRAVENOUS

## 2023-04-23 MED ORDER — LACTATED RINGERS IV SOLN: INTRAVENOUS | Status: DC | PRN

## 2023-04-23 MED ORDER — LIDOCAINE HCL (CARDIAC) PF 100 MG/5ML IV SOSY
PREFILLED_SYRINGE | INTRAVENOUS | Status: DC | PRN
  Administered 2023-04-23: 100 mg via INTRAVENOUS

## 2023-04-23 MED ORDER — FENTANYL CITRATE (PF) 100 MCG/2ML IJ SOLN
INTRAMUSCULAR | Status: DC | PRN
  Administered 2023-04-23: 100 ug via INTRAVENOUS

## 2023-04-23 MED ORDER — ONDANSETRON HCL 4 MG PO TABS: 4.0000 mg | ORAL_TABLET | Freq: Four times a day (QID) | ORAL | Status: DC | PRN

## 2023-04-23 MED ORDER — DEXAMETHASONE SODIUM PHOSPHATE 10 MG/ML IJ SOLN
INTRAMUSCULAR | Status: DC | PRN
  Administered 2023-04-23: 4 mg via INTRAVENOUS

## 2023-04-23 MED ORDER — PROPOFOL 10 MG/ML IV BOLUS
INTRAVENOUS | Status: DC | PRN
  Administered 2023-04-23: 250 mg via INTRAVENOUS

## 2023-04-23 MED ORDER — OXYCODONE HCL 5 MG PO TABS: 5.0000 mg | ORAL_TABLET | ORAL | Status: DC | PRN

## 2023-04-23 MED ORDER — METHYLPREDNISOLONE SODIUM SUCC 125 MG IJ SOLR
INTRAMUSCULAR | Status: DC | PRN
  Administered 2023-04-23: 125 mg via INTRAVENOUS

## 2023-04-23 MED ORDER — OXYCODONE HCL 5 MG PO TABS: 5.0000 mg | ORAL_TABLET | Freq: Three times a day (TID) | ORAL | 0 refills | Status: DC | PRN

## 2023-04-23 MED ORDER — SODIUM CHLORIDE 0.9 % IV SOLN: INTRAVENOUS | Status: DC

## 2023-04-23 MED ORDER — EPHEDRINE SULFATE (PRESSORS) 50 MG/ML IJ SOLN
INTRAMUSCULAR | Status: DC | PRN
  Administered 2023-04-23: 10 mg via INTRAVENOUS

## 2023-04-23 MED ORDER — ONDANSETRON HCL 4 MG/2ML IJ SOLN
INTRAMUSCULAR | Status: DC | PRN
  Administered 2023-04-23: 4 mg via INTRAVENOUS

## 2023-04-23 MED ORDER — ACETAMINOPHEN 325 MG PO TABS: 325.0000 mg | ORAL_TABLET | Freq: Four times a day (QID) | ORAL | Status: DC | PRN

## 2023-04-23 MED ORDER — METOCLOPRAMIDE HCL 5 MG PO TABS: 5.0000 mg | ORAL_TABLET | Freq: Three times a day (TID) | ORAL | Status: DC | PRN

## 2023-04-23 MED ORDER — LACTATED RINGERS IV SOLN: INTRAVENOUS | Status: DC

## 2023-04-23 MED ORDER — BUPIVACAINE HCL 0.5 % IJ SOLN
INTRAMUSCULAR | Status: DC | PRN
  Administered 2023-04-23: 10 mL

## 2023-04-23 MED ORDER — METOCLOPRAMIDE HCL 5 MG/ML IJ SOLN: 5.0000 mg | Freq: Three times a day (TID) | INTRAMUSCULAR | Status: DC | PRN

## 2023-04-23 SURGICAL SUPPLY — 20 items
APL PRP STRL LF DISP 70% ISPRP (MISCELLANEOUS) ×1
BNDG CMPR STD VLCR NS LF 5.8X3 (GAUZE/BANDAGES/DRESSINGS) ×1
BNDG ELASTIC 3X5.8 VLCR NS LF (GAUZE/BANDAGES/DRESSINGS) IMPLANT
CHLORAPREP W/TINT 26 (MISCELLANEOUS) ×1 IMPLANT
COVER LIGHT HANDLE UNIVERSAL (MISCELLANEOUS) ×2 IMPLANT
CUFF TOURN SGL QUICK 18X4 (TOURNIQUET CUFF) IMPLANT
GAUZE SPONGE 4X4 12PLY STRL (GAUZE/BANDAGES/DRESSINGS) ×1 IMPLANT
GAUZE XEROFORM 1X8 LF (GAUZE/BANDAGES/DRESSINGS) ×1 IMPLANT
GLOVE SURG SYN 9.0 PF PI (GLOVE) ×1 IMPLANT
GOWN STRL REIN 2XL XLG LVL4 (GOWN DISPOSABLE) ×2 IMPLANT
GOWN STRL REUS W/ TWL LRG LVL3 (GOWN DISPOSABLE) ×1 IMPLANT
GOWN STRL REUS W/TWL LRG LVL3 (GOWN DISPOSABLE) ×1
KIT TURNOVER KIT A (KITS) ×1 IMPLANT
NS IRRIG 500ML POUR BTL (IV SOLUTION) ×1 IMPLANT
PACK EXTREMITY ARMC (MISCELLANEOUS) ×1 IMPLANT
PAD CAST 4YDX4 CTTN HI CHSV (CAST SUPPLIES) ×1 IMPLANT
PADDING CAST COTTON 4X4 STRL (CAST SUPPLIES) ×1
SUT ETHILON 4-0 (SUTURE) ×1
SUT ETHILON 4-0 FS2 18XMFL BLK (SUTURE) ×1
SUTURE ETHLN 4-0 FS2 18XMF BLK (SUTURE) ×1 IMPLANT

## 2023-04-23 NOTE — Discharge Instructions (Signed)
Loosen Ace wrap prior to dismissal and if the fingers swell at home  okay to work on range of motion of fingers  Pain medicine as previously directed Call office if you are having any problems  [phone] 

## 2023-04-23 NOTE — Anesthesia Procedure Notes (Signed)
Procedure Name: LMA Insertion Date/Time: 04/23/2023 12:50 PM  Performed by: Genia Del, CRNAPre-anesthesia Checklist: Patient identified, Emergency Drugs available, Suction available, Patient being monitored and Timeout performed Patient Re-evaluated:Patient Re-evaluated prior to induction Oxygen Delivery Method: Circle system utilized Preoxygenation: Pre-oxygenation with 100% oxygen Induction Type: IV induction LMA: LMA inserted LMA Size: 4.0 Tube type: Oral Number of attempts: 1 Placement Confirmation: positive ETCO2, CO2 detector and breath sounds checked- equal and bilateral Tube secured with: Tape Dental Injury: Teeth and Oropharynx as per pre-operative assessment

## 2023-04-23 NOTE — Op Note (Signed)
04/23/2023  1:02 PM  PATIENT:  Stephanie Holland  57 y.o. female  PRE-OPERATIVE DIAGNOSIS:  Carpal tunnel syndrome, bilateral upper limbs G56.03  POST-OPERATIVE DIAGNOSIS:  Carpal tunnel syndrome, bilateral upper limbs G56.03  PROCEDURE:  Procedure(s): CARPAL TUNNEL RELEASE (Right)  SURGEON: Leitha Schuller, MD  ASSISTANTS: none  ANESTHESIA:   general  EBL:  Total I/O In: -  Out: 0.5 [Blood:0.5]  BLOOD ADMINISTERED:none  DRAINS: none   LOCAL MEDICATIONS USED:  MARCAINE     SPECIMEN:  No Specimen  DISPOSITION OF SPECIMEN:  N/A  COUNTS:  YES  TOURNIQUET:   Total Tourniquet Time Documented: Forearm (Right) - 10 minutes Total: Forearm (Right) - 10 minutes   IMPLANTS: none  DICTATION: .Dragon Dictation   patient was brought to the operating room and after adequate anesthesia was obtained the right arm was prepped and draped in the usual sterile fashion.  After patient identification timeout procedure was completed following this the arm was prepped and draped with a tourniquet to the upper forearm tourniquet was raised and incised incision was made in line with the ring metacarpal approximately 2 cm in length.  Subcutaneous tissue was spread and there was some thenar musculature overlying the transverse carpal ligament this was elevated the ligament was very thickened and when it was opened with a vascular hemostat was placed deep to protect the underlying structures.  Release carried out distally and then proximally in the mid carpal tunnel there was a significant area of constriction and when this was released there is good vascular blush to the nerve release carried out to about a centimeter proximal to the wrist flexion crease and there did not appear to be any other evidence of compression.  10 cc of half percent Sensorcaine plain was infiltrated around the incision obtain postop analgesia.  The wound was then closed with simple erupted 4-0 nylon skin sutures followed by  Xeroform 4 x 4 web roll and Ace wrap   PLAN OF CARE: Discharge to home after PACU  PATIENT DISPOSITION:  PACU - hemodynamically stable.

## 2023-04-23 NOTE — Transfer of Care (Signed)
Immediate Anesthesia Transfer of Care Note  Patient: Stephanie Holland  Procedure(s) Performed: CARPAL TUNNEL RELEASE (Right: Wrist)  Patient Location: PACU  Anesthesia Type:General  Level of Consciousness: awake, alert , and oriented  Airway & Oxygen Therapy: Patient Spontanous Breathing  Post-op Assessment: Report given to RN and Post -op Vital signs reviewed and stable  Post vital signs: Reviewed and stable  Last Vitals: V/S seen by CRNA and wnl.   See PACU flow sheet  Vitals Value Taken Time  BP    Temp    Pulse    Resp    SpO2      Last Pain:  Vitals:   04/23/23 1045  TempSrc: Temporal  PainSc: 10-Worst pain ever         Complications: No notable events documented.

## 2023-04-23 NOTE — H&P (Signed)
Chief Complaint Patient presents with Left Wrist - Numbness, Pain   History of the Present Illness: Stephanie Holland is a 57 y.o. female who presents for evaluation of bilateral hand pain. She has been experiencing severe pain for the past 3 weeks. EMG nerve conduction test obtained at [facility] in 2019 showed moderate to severe left carpal tunnel and mild right carpal tunnel.  The patient was last evaluated by myself 2 months ago. She notes her symptoms are worse with constant pain. She reports experiencing severe pain in both hands which significantly impedes her ability to perform daily activities such as using the bathroom and performing personal hygiene tasks. She describes a burning sensation in her hands, akin to having been pricked with pins that was not present at her last visit. She also reports pain of her elbows and shoulder. An injection administered by Dr. Landry Mellow provided relief for approximately 2 weeks. She reports that meloxicam and cyclobenzaprine make her sleepy. She still takes meloxicam, but she discontinued cyclobenzaprine.  She works as a Child psychotherapist. She resides at a recovery home.  I have reviewed past medical, surgical, social and family history, and allergies as documented in the EMR.  Past Medical History: Past Medical History: Diagnosis Date Anxiety and depression  Past Surgical History: History reviewed. No pertinent surgical history.  Past Family History: Family History Problem Relation Age of Onset Clotting disorder Mother Heart disease Father Diabetes type II Father Diabetes type II Brother  Medications: Current Outpatient Medications Medication Sig Dispense Refill cetirizine (ZYRTEC) 10 MG tablet Take 1 tablet by mouth once daily dicyclomine (BENTYL) 20 mg tablet Take 1 tablet by mouth 4 (four) times daily hydrOXYzine (ATARAX) 25 MG tablet Take 25 mg by mouth 4 (four) times daily as needed for Anxiety hyoscyamine (LEVSIN) 0.125 mg tablet Take 1 tablet by  mouth every 4 (four) hours as needed for Cramping or Diarrhea meloxicam (MOBIC) 15 MG tablet Take 1 tablet (15 mg total) by mouth once daily 30 tablet 11 metoprolol tartrate (LOPRESSOR) 100 MG tablet Take 100 mg by mouth as directed sertraline (ZOLOFT) 50 MG tablet Take 1 tablet by mouth once daily traZODone (DESYREL) 50 MG tablet Take 50 mg by mouth at bedtime gabapentin (NEURONTIN) 300 MG capsule Take 1 capsule (300 mg total) by mouth 3 (three) times daily 90 capsule 1  No current facility-administered medications for this visit.  Allergies: No Known Allergies  Body mass index is 32.64 kg/m.  Review of Systems: A comprehensive 14 point ROS was performed, reviewed, and the pertinent orthopaedic findings are documented in the HPI.  Vitals: 04/04/23 0921 BP: 122/88   General Physical Examination:  General/Constitutional: No apparent distress: well-nourished and well developed. Eyes: Pupils equal, round with synchronous movement. Lungs: Clear to auscultation HEENT: Normal Vascular: No edema, swelling or tenderness, except as noted in detailed exam. Cardiac: Heart rate and rhythm is regular. Integumentary: No impressive skin lesions present, except as noted in detailed exam. Neuro/Psych: Normal mood and affect, oriented to person, place, and time.  Musculoskeletal Examination: No atrophy noted in the hands. Decreased sensation in the right hand. Positive Tinel's sign on the right, about a centimeter from the distal flexion crease. On the left hand, positive Tinel's sign. The right hand is positive at about 4 seconds. Positive Phalen's test bilaterally.  Radiographs:  No new imaging studies were obtained or reviewed today.  Assessment: ICD-10-CM 1. Carpal tunnel syndrome, bilateral upper limbs G56.03  Plan:  The patient has clinical findings of bilateral carpal tunnel  syndrome.  I provided the patient with a prescription for a nerve pain medication, to be taken thrice  daily. She will discontinue cyclobenzaprine. We discussed the option of surgery and she viewed our video on carpal tunnel syndrome.  Surgical Risks:  The nature of the condition and the proposed procedure has been reviewed in detail with the patient. Surgical versus non-surgical options and prognosis for recovery have been reviewed and the inherent risks and benefits of each have been discussed including the risks of infection, bleeding, injury to nerves/blood vessels/tendons, incomplete relief of symptoms, persisting pain and/or stiffness, loss of function, complex regional pain syndrome, failure of the procedure, as appropriate.  Document Attestation: Carita Pian, have reviewed and updated documentation for [name], MD, utilizing [employer] DAX.  Electronically signed by Marlena Clipper, MD at 04/04/2023 12:20 PM EDT    Reviewed  H+P. No changes noted.

## 2023-04-24 ENCOUNTER — Encounter: Payer: Self-pay | Admitting: Orthopedic Surgery

## 2023-04-24 NOTE — Anesthesia Postprocedure Evaluation (Signed)
Anesthesia Post Note  Patient: Stephanie Holland  Procedure(s) Performed: CARPAL TUNNEL RELEASE (Right: Wrist)  Patient location during evaluation: PACU Anesthesia Type: General Level of consciousness: awake and alert Pain management: pain level controlled Vital Signs Assessment: post-procedure vital signs reviewed and stable Respiratory status: spontaneous breathing, nonlabored ventilation, respiratory function stable and patient connected to nasal cannula oxygen Cardiovascular status: blood pressure returned to baseline and stable Postop Assessment: no apparent nausea or vomiting Anesthetic complications: no   No notable events documented.   Last Vitals:  Vitals:   04/23/23 1315 04/23/23 1323  BP: 128/84 121/72  Pulse:    Resp: 13 11  Temp:  36.9 C  SpO2: 93% 95%    Last Pain:  Vitals:   04/24/23 1259  TempSrc:   PainSc: 0-No pain                 Austin Pongratz C Romulo Okray

## 2023-05-03 ENCOUNTER — Other Ambulatory Visit: Payer: Self-pay

## 2023-05-06 ENCOUNTER — Other Ambulatory Visit: Payer: Self-pay

## 2023-05-07 ENCOUNTER — Other Ambulatory Visit: Payer: Self-pay

## 2023-05-07 MED ORDER — PREDNISONE 10 MG PO TABS
ORAL_TABLET | ORAL | 0 refills | Status: DC
  Filled 2023-05-07: qty 21, 6d supply, fill #0

## 2023-05-07 MED ORDER — SERTRALINE HCL 100 MG PO TABS
200.0000 mg | ORAL_TABLET | Freq: Every day | ORAL | 1 refills | Status: DC
  Filled 2023-05-07 – 2023-06-04 (×3): qty 60, 30d supply, fill #0
  Filled 2023-07-10: qty 60, 30d supply, fill #1

## 2023-05-07 MED ORDER — TRAZODONE HCL 100 MG PO TABS
150.0000 mg | ORAL_TABLET | Freq: Every evening | ORAL | 1 refills | Status: DC | PRN
  Filled 2023-05-07: qty 45, 30d supply, fill #0
  Filled 2023-06-12: qty 45, 30d supply, fill #1

## 2023-05-22 ENCOUNTER — Other Ambulatory Visit: Payer: Self-pay

## 2023-05-22 MED ORDER — PREGABALIN 75 MG PO CAPS
75.0000 mg | ORAL_CAPSULE | Freq: Two times a day (BID) | ORAL | 1 refills | Status: DC
  Filled 2023-05-22: qty 60, 30d supply, fill #0

## 2023-05-24 ENCOUNTER — Other Ambulatory Visit: Payer: Self-pay | Admitting: Orthopedic Surgery

## 2023-05-24 DIAGNOSIS — M4802 Spinal stenosis, cervical region: Secondary | ICD-10-CM

## 2023-05-26 ENCOUNTER — Other Ambulatory Visit: Payer: Self-pay

## 2023-05-26 ENCOUNTER — Emergency Department
Admission: EM | Admit: 2023-05-26 | Discharge: 2023-05-26 | Disposition: A | Discharge status: Home / Self Care | Payer: MEDICAID | Attending: Emergency Medicine | Admitting: Emergency Medicine

## 2023-05-26 DIAGNOSIS — M542 Cervicalgia: Secondary | ICD-10-CM | POA: Diagnosis not present

## 2023-05-26 DIAGNOSIS — M79601 Pain in right arm: Secondary | ICD-10-CM | POA: Diagnosis not present

## 2023-05-26 DIAGNOSIS — M79602 Pain in left arm: Secondary | ICD-10-CM | POA: Diagnosis not present

## 2023-05-26 MED ORDER — DIAZEPAM 5 MG PO TABS
10.0000 mg | ORAL_TABLET | Freq: Once | ORAL | Status: AC
  Administered 2023-05-26: 10 mg via ORAL
  Filled 2023-05-26: qty 2

## 2023-05-26 MED ORDER — DIAZEPAM 5 MG PO TABS
5.0000 mg | ORAL_TABLET | Freq: Four times a day (QID) | ORAL | 0 refills | Status: AC | PRN
  Filled 2023-05-26: qty 15, 4d supply, fill #0

## 2023-05-26 MED ORDER — OXYCODONE-ACETAMINOPHEN 5-325 MG PO TABS
1.0000 | ORAL_TABLET | Freq: Once | ORAL | Status: AC
  Administered 2023-05-26: 1 via ORAL
  Filled 2023-05-26: qty 1

## 2023-05-26 NOTE — ED Provider Notes (Signed)
[facility] Provider Note    Event Date/Time   First MD Initiated Contact with Patient 05/26/23 0559     (approximate)   History   Pain   HPI  Stephanie Holland is a 57 y.o. female with history of bipolar disorder, right carpal tunnel release on 04/23/23 presenting to the emergency department for evaluation of neck and arm pain.    I did review her outpatient note from orthopedics from 4 days ago.  At that time, it was felt that she had a likely cervical root nerve impingement.  She was ordered from an MRI of the cervical spine that is scheduled for Wednesday.   Patient presents today for ongoing pain in her hands.  Reports she has been having this pain intermittently.  She has been taking Lyrica with limited benefit.  She had been prescribed oxycodone following her carpal tunnel surgery but reports that she was never able to fill this.  She is concerned about her pain control until she is able to attend her follow-up.  No fevers.  No new numbness, tingling, focal weakness.     Physical Exam   Triage Vital Signs: ED Triage Vitals  Encounter Vitals Group     BP 05/26/23 0545 (!) 136/91     Systolic BP Percentile --      Diastolic BP Percentile --      Pulse Rate 05/26/23 0545 88     Resp 05/26/23 0545 20     Temp 05/26/23 0545 98.5 F (36.9 C)     Temp Source 05/26/23 0545 Oral     SpO2 05/26/23 0545 97 %     Weight 05/26/23 0546 206 lb (93.4 kg)     Height 05/26/23 0546 5\' 6"  (1.676 m)     Head Circumference --      Peak Flow --      Pain Score 05/26/23 0551 10     Pain Loc --      Pain Education --      Exclude from Growth Chart --     Most recent vital signs: Vitals:   05/26/23 0700 05/26/23 0730  BP: 136/80 125/75  Pulse: 76 78  Resp:  16  Temp:    SpO2: 96% 95%     General: Awake, interactive  HEENT: Tenderness over the posterior cervical region without palpable step-off CV:  Regular rate, good peripheral perfusion.  Resp:  Lungs  clear, unlabored respirations.  Abd:  Soft, nondistended.  Neuro:  Symmetric facial movement, fluid speech MSK:  5-5 strength in the bilateral upper and lower extremities, though patient does have pain with attempts at range of motion.  Sensation symmetric to light touch.  Symmetric facial movement.  ED Results / Procedures / Treatments   Labs (all labs ordered are listed, but only abnormal results are displayed) Labs Reviewed - No data to display   EKG EKG independently reviewed interpreted by myself (ER attending) demonstrates:    RADIOLOGY Imaging independently reviewed and interpreted by myself demonstrates:    PROCEDURES:  Critical Care performed: No  Procedures   MEDICATIONS ORDERED IN ED: Medications  diazepam (VALIUM) tablet 10 mg (10 mg Oral Given 05/26/23 0643)  oxyCODONE-acetaminophen (PERCOCET/ROXICET) 5-325 MG per tablet 1 tablet (1 tablet Oral Given 05/26/23 9562)     IMPRESSION / MDM / ASSESSMENT AND PLAN / ED COURSE  I reviewed the triage vital signs and the nursing notes.  Differential diagnosis includes, but is not limited to, ongoing pain  from suspected cervical root nerve impingement.  No recent trauma or focal neurologic deficits to suggest acute spinal cord compression.  Possible musculoskeletal strain.  Patient's presentation is most consistent with acute illness / injury with system symptoms.  57 year old female presenting with ongoing upper extremity pain, currently undergoing evaluation as an outpatient with orthopedics.  She has no new neurologic deficits on exam here.  She has an outpatient MRI ordered for Wednesday.  She was ordered for symptomatic treatment here with Valium and oxycodone.  On reevaluation, patient does report some ongoing pain, but does feel that it is improved.  Reports she has previously tried cyclobenzaprine with limited benefit.  Will DC with short course of Valium.  Strict return precautions provided.  Patient discharged  stable condition.      FINAL CLINICAL IMPRESSION(S) / ED DIAGNOSES   Final diagnoses:  Cervical pain  Pain in both upper extremities     Rx / DC Orders   ED Discharge Orders          Ordered    diazepam (VALIUM) 5 MG tablet  Every 6 hours PRN        05/26/23 0749             Note:  This document was prepared using Dragon voice recognition software and may include unintentional dictation errors.   Trinna Post, MD 05/26/23 [phone]

## 2023-05-26 NOTE — ED Notes (Signed)
Pt resting comfortably at this time with eyes closed. Respirations even and unlabored.

## 2023-05-26 NOTE — ED Notes (Signed)
Prior to administering the medications to the pt that were ordered, pt was asked if she has a ride home. She called her aunt, Britta Mccreedy that said she can pick the pt up and bring her home when ready.

## 2023-05-26 NOTE — Discharge Instructions (Addendum)
You were seen in the ER today for your neck and arm pain.  I am glad you feeling better after receiving medicine here.  I sent a prescription for Valium to your pharmacy for muscle relaxation.  Do not take this with other muscle relaxers.  Keep your scheduled follow-up with orthopedics and for your MRI.  Return to the ER for new or worsening symptoms.

## 2023-05-26 NOTE — ED Triage Notes (Signed)
Pt c/o worsening neck pain that radiates into the bilateral shoulder, elbows, wrists, and hands. Sts she has been evaluated ortho 6/25 for the same pain, that is worse and "unbearable" today. Has pending MRI Wednesday. Was recently prescribed Lyrica, with no relief.

## 2023-05-27 ENCOUNTER — Other Ambulatory Visit: Payer: Self-pay

## 2023-05-28 ENCOUNTER — Encounter: Payer: Self-pay | Admitting: Cardiology

## 2023-05-28 ENCOUNTER — Ambulatory Visit: Payer: MEDICAID | Attending: Cardiology | Admitting: Cardiology

## 2023-05-29 ENCOUNTER — Ambulatory Visit
Admission: RE | Admit: 2023-05-29 | Discharge: 2023-05-29 | Disposition: A | Discharge status: Home / Self Care | Payer: MEDICAID | Source: Ambulatory Visit | Attending: Orthopedic Surgery | Admitting: Orthopedic Surgery

## 2023-05-29 DIAGNOSIS — M4802 Spinal stenosis, cervical region: Secondary | ICD-10-CM | POA: Insufficient documentation

## 2023-06-03 ENCOUNTER — Other Ambulatory Visit: Payer: Self-pay

## 2023-06-04 ENCOUNTER — Other Ambulatory Visit: Payer: Self-pay

## 2023-06-06 ENCOUNTER — Other Ambulatory Visit: Payer: Self-pay

## 2023-06-13 ENCOUNTER — Other Ambulatory Visit: Payer: Self-pay

## 2023-06-18 ENCOUNTER — Other Ambulatory Visit: Payer: Self-pay

## 2023-06-18 MED ORDER — TRAZODONE HCL 100 MG PO TABS
150.0000 mg | ORAL_TABLET | Freq: Every evening | ORAL | 0 refills | Status: DC | PRN
  Filled 2023-06-18 – 2023-07-10 (×2): qty 45, 30d supply, fill #0

## 2023-06-18 MED ORDER — ARIPIPRAZOLE 5 MG PO TABS
5.0000 mg | ORAL_TABLET | Freq: Every day | ORAL | 1 refills | Status: DC
  Filled 2023-06-18: qty 30, 30d supply, fill #0
  Filled 2023-08-20 – 2023-09-27 (×2): qty 30, 30d supply, fill #1

## 2023-06-24 ENCOUNTER — Other Ambulatory Visit: Payer: Self-pay | Admitting: Orthopedic Surgery

## 2023-06-24 DIAGNOSIS — M4807 Spinal stenosis, lumbosacral region: Secondary | ICD-10-CM

## 2023-07-02 ENCOUNTER — Ambulatory Visit
Admission: RE | Admit: 2023-07-02 | Discharge: 2023-07-02 | Disposition: A | Discharge status: Home / Self Care | Payer: MEDICAID | Source: Ambulatory Visit | Attending: Orthopedic Surgery | Admitting: Orthopedic Surgery

## 2023-07-02 DIAGNOSIS — M4807 Spinal stenosis, lumbosacral region: Secondary | ICD-10-CM | POA: Insufficient documentation

## 2023-07-03 ENCOUNTER — Other Ambulatory Visit: Payer: Self-pay

## 2023-07-03 MED ORDER — GABAPENTIN 300 MG PO CAPS
300.0000 mg | ORAL_CAPSULE | Freq: Every day | ORAL | 0 refills | Status: DC
  Filled 2023-07-03: qty 60, 30d supply, fill #0

## 2023-07-03 MED ORDER — PREDNISONE 10 MG PO TABS
ORAL_TABLET | ORAL | 0 refills | Status: DC
  Filled 2023-07-03: qty 21, 6d supply, fill #0

## 2023-07-05 ENCOUNTER — Other Ambulatory Visit: Payer: Self-pay

## 2023-07-05 NOTE — Progress Notes (Unsigned)
Referring Physician:  Evon Slack, PA-C [address] [place],  [place] [address]  Primary Physician:  Sandrea Hughs, NP  History of Present Illness: 07/05/2023 Stephanie Holland is here today with a chief complaint of *** multiple levels of multifactorial moderate cervical spinal stenosis. She had nerve conduction studies 5 years ago showing bilateral moderate to severe chronic carpal tunnel syndrome. She had 1 carpal tunnel release but still having some numbness and tingling and discomfort in both arms.    Duration/Date of Injury: 6 months Mechanism of Injury: *** Specific Nerve if known: {ANATOMY; NERVES:23155} Weakness: ***, {Improving/worsening/no change:60406} Pain yes, No Change Numbness yes, No Change  Conservative measures:  Physical therapy: {yes/no:20286} Occupational Therapy: {yes/no:20286} Hand Therapy: No Injections: No Gabapentin: Yes, Lyrica: Yes, Cymbalta: No, Prednisone  06/12/2023: Left C5-6 transforaminal ESI   Past Surgery: {yes/no:20286}  carpal tunnel release on 04/23/2023 in [place].  Is this a Second Opinion: {yes/no:20286}  Main Question for Surgeon: ***  Review of Systems:  A 10 point review of systems is negative, except for the pertinent positives and negatives detailed in the HPI.  Past Medical History: Past Medical History:  Diagnosis Date   Alcohol abuse    Bipolar 1 disorder, depressed (HCC)    IBS (irritable bowel syndrome)    Insomnia    Manic depression (HCC)    Motion sickness    cars    Past Surgical History: Past Surgical History:  Procedure Laterality Date   APPENDECTOMY     CARPAL TUNNEL RELEASE Right 04/23/2023   Procedure: CARPAL TUNNEL RELEASE;  Surgeon: Kennedy Bucker, MD;  Location[facility];  Service: Orthopedics;  Laterality: Right;   COLONOSCOPY WITH PROPOFOL N/A 03/15/2023   Procedure: COLONOSCOPY WITH PROPOFOL;  Surgeon: Midge Minium, MD;  [facility];  Service: Endoscopy;   Laterality: N/A;    Allergies: Allergies as of 07/10/2023 - Review Complete 05/26/2023  Allergen Reaction Noted   Lactose intolerance (gi) Nausea Only 04/16/2023   Hydrocodone Itching and Rash 04/16/2023    Medications:  Current Outpatient Medications:    APPLE CIDER VINEGAR PO, Take by mouth daily., Disp: , Rfl:    ARIPiprazole (ABILIFY) 5 MG tablet, Take 1 tablet (5 mg total) by mouth daily., Disp: 30 tablet, Rfl: 1   gabapentin (NEURONTIN) 300 MG capsule, Take 1 capsule (300 mg total) by mouth at bedtime for 4 days, then progress to 1 capsule (300 mg total) in the morning and at night., Disp: 60 capsule, Rfl: 0   meloxicam (MOBIC) 7.5 MG tablet, Take 7.5 mg by mouth daily as needed., Disp: , Rfl:    oxyCODONE (ROXICODONE) 5 MG immediate release tablet, Take 1 tablet (5 mg total) by mouth every 8 (eight) hours as needed., Disp: 20 tablet, Rfl: 0   predniSONE (DELTASONE) 10 MG tablet, Take 6 tablets (60 mg total) by mouth daily for 1 day, THEN 5 tablets (50 mg total) daily for 1 day, THEN 4 tablets (40 mg total) daily for 1 day, THEN 3 tablets (30 mg total) daily for 1 day, THEN 2 tablets (20 mg total) daily for 1 day, THEN 1 tablet (10 mg total) daily for 1 day., Disp: 21 tablet, Rfl: 0   sertraline (ZOLOFT) 100 MG tablet, Take 1 tablet (100 mg total) by mouth daily. (Patient taking differently: Take 200 mg by mouth daily.), Disp: 30 tablet, Rfl: 1   sertraline (ZOLOFT) 100 MG tablet, Take 2 tablets (200 mg total) by mouth daily., Disp:  60 tablet, Rfl: 1   traZODone (DESYREL) 100 MG tablet, Take 1.5 tablets (150 mg total) by mouth at bedtime as needed., Disp: 45 tablet, Rfl: 1   traZODone (DESYREL) 100 MG tablet, Take 1.5 tablets (150 mg total) by mouth at bedtime as needed., Disp: 45 tablet, Rfl: 0  Social History: Social History   Tobacco Use   Smoking status: Never   Smokeless tobacco: Never  Vaping Use   Vaping status: Never Used  Substance Use Topics   Alcohol use: Not  Currently    Comment: quit 07/19/2022   Drug use: Not Currently    Types: Cocaine, Methamphetamines, "Crack" cocaine    Comment: quit 07/19/2022    Family Medical History: Family History  Problem Relation Age of Onset   Heart disease Mother    Heart disease Father     Physical Examination: There were no vitals filed for this visit.  General: Patient is in no apparent distress. Attention to examination is appropriate.  Neck:   Supple.  Full range of motion.  Respiratory: Patient is breathing without any difficulty.   NEUROLOGICAL:     Awake, alert, oriented to person, place, and time.  Speech is clear and fluent.   Cranial Nerves: Pupils equal round and reactive to light.  Facial tone is symmetric. Shoulder shrug is symmetric. Tongue protrusion is midline.  There is no pronator drift.  Motor Exam:  ***  Reflexes are ***2+ and symmetric at the biceps, triceps, brachioradialis, patella and achilles.   Hoffman's is absent. Clonus is Absent  Bilateral upper and lower extremity sensation is intact to light touch ***.     Gait is normal.  ***   Medical Decision Making  Imaging: ***  Electrodiagnostics: ***  I have personally reviewed the images and electrodiagnostics and agree with the above interpretation.  Assessment and Plan: Stephanie Holland is a pleasant 57 y.o. female with ***. The symptoms are causing a significant impact on the patient's life. I have utilized the care everywhere function in epic to review the outside records available from external health systems, and have personally reviewed relevant imaging and electrodiagnostic workup.    Thank you for involving me in the care of this patient.    Lovenia Kim MD/MSCR Neurosurgery - Peripheral Nerve Surgery

## 2023-07-10 ENCOUNTER — Other Ambulatory Visit: Payer: Self-pay

## 2023-07-10 ENCOUNTER — Ambulatory Visit (INDEPENDENT_AMBULATORY_CARE_PROVIDER_SITE_OTHER): Payer: MEDICAID | Admitting: Neurosurgery

## 2023-07-10 ENCOUNTER — Encounter: Payer: Self-pay | Admitting: Neurosurgery

## 2023-07-10 VITALS — BP 130/76 | Ht 66.0 in | Wt 207.0 lb

## 2023-07-10 DIAGNOSIS — M4722 Other spondylosis with radiculopathy, cervical region: Secondary | ICD-10-CM

## 2023-07-10 DIAGNOSIS — M4712 Other spondylosis with myelopathy, cervical region: Secondary | ICD-10-CM | POA: Insufficient documentation

## 2023-07-10 DIAGNOSIS — M542 Cervicalgia: Secondary | ICD-10-CM

## 2023-07-10 DIAGNOSIS — G4486 Cervicogenic headache: Secondary | ICD-10-CM | POA: Diagnosis not present

## 2023-07-10 NOTE — Patient Instructions (Signed)
 LOCAL PHYSICAL THERAPY  [facility]  [address]  [place], [place] [address]  [phone]  [facility]  [address] [place], [place] [address]  [phone]  [facility] (2 locations)  [address]  [place], [place] [address]  [phone]          or  [address]  [place], [place] [address]  [phone]  [facility]  [address]  [place], [place] [address]  [phone]  **dry needling**  [facility])  [address]  [place], [place] [address]  [phone]  Fax: [phone]  ** Aquatic therapy[address] [place], [place] [address] [phone] **Aquatic therapy**  [facility]  [facility]  [address]  [place], [place] [address]  [phone]  [facility]  [address]  [place], [place] [address]  [phone]  [facility]  [address]   Janora Norlander  [place], [place] [address]  [phone]  [facility]  [address]  [place], [place] [address]  [phone]  **dry needling**   PELVIC FLOOR/SI JOINT  [facility]  Mariane Masters, PT  [email or web address]   [place]  [facility]  [address]  [address] [place], [place] [address]  [phone]   [facility] [place]  [address], [place] [address]  [phone]   [facility], [place]  [facility]  Raymond Gurney, PT  [address]  [place], [place] [address] [phone]   Samara Deist  [facility]  [address]  [phone]   [facility]  [address]  [phone]   [facility]  Annamaria Boots [facility]  [address]  [place], [place] [address]  [phone]   **No Aetna or medicaid**  [facility]  [phone] [address] [place] [address]  [phone]  [facility]  [address]  [place], [place] [address]  [phone]  Dannial Monarch, DC  [address], [place] [address]  [phone]  [facility]  [address], [place] [address]  [phone]  [facility]  [address]  [place], [place] [address]  [phone]  [facility]  (3 locations)  [address]  [place], [place] [address]  [phone]  **dry needling**           or  [address] [place], [place] [address]  [phone]  **Additionally has Gloris Manchester, OT**           or  [address]  [place], [place] [address]  [phone]  **Pediatric therapy**  [facility]  [address]  [phone]  **dry needlingVerdie Drown [facility]  [address]  [place], [place] [address]  [phone]  [facility]   (Inside [address] Fitness)  [address]  [place], [place] [address]  [phone]  **dry needling**  **MEDICAID or UNINSURED** [facility] dept. Of Physical Therapy [place], [place] [address] [phone]  [facility]  [address] [place], [place] [address]  [phone]   [facility]  [address]  [place], [place] [address]  [phone]   Doreatha Martin  [facility]  [address] [place], [place] [address]  [phone]   [facility]  [address]  [place], [place] [address]  [phone]   [facility]  [address] [place], [place] [address]  [phone]  [facility]  [address]  [place], [place] [address]  [phone]  AQUATIC  Kathalene Frames [facility]  [employer]  [facility]  [place]  [place]  *Residents only*   [facility] [employer]  *[facility]  Exercise class  [facility]  Exercise class  [facility]  [address]  [place], [place]   [phone]  [facility]  [address]  [place], [place] [address]  [phone]   [place], [place]  [facility]  [address]  [phone]   [facility]  [facility]  [address]  [phone]           or  [address]  [phone]   [facility]   Provides education and support and practical information for coping with arthritis for arthritis sufferers and their families.   When: 12:15 - 1:30 p.m. the second Monday of each month, March through December  Info: Call Rehabilitation Services at [phone]

## 2023-07-11 ENCOUNTER — Other Ambulatory Visit: Payer: Self-pay

## 2023-07-11 ENCOUNTER — Ambulatory Visit
Admission: RE | Admit: 2023-07-11 | Discharge: 2023-07-11 | Disposition: A | Discharge status: Home / Self Care | Payer: MEDICAID | Attending: Neurosurgery | Admitting: Neurosurgery

## 2023-07-11 ENCOUNTER — Ambulatory Visit
Admission: RE | Admit: 2023-07-11 | Discharge: 2023-07-11 | Disposition: A | Discharge status: Home / Self Care | Payer: MEDICAID | Source: Ambulatory Visit | Attending: Neurosurgery | Admitting: Neurosurgery

## 2023-07-11 DIAGNOSIS — M4712 Other spondylosis with myelopathy, cervical region: Secondary | ICD-10-CM | POA: Diagnosis present

## 2023-07-11 DIAGNOSIS — M542 Cervicalgia: Secondary | ICD-10-CM | POA: Diagnosis present

## 2023-07-17 ENCOUNTER — Other Ambulatory Visit: Payer: Self-pay

## 2023-07-17 ENCOUNTER — Encounter: Payer: Self-pay | Admitting: Neurosurgery

## 2023-07-17 MED ORDER — PREGABALIN 75 MG PO CAPS
75.0000 mg | ORAL_CAPSULE | Freq: Two times a day (BID) | ORAL | 2 refills | Status: DC
  Filled 2023-07-17: qty 60, 30d supply, fill #0
  Filled 2023-08-20: qty 60, 30d supply, fill #1
  Filled 2023-08-26: qty 32, 16d supply, fill #1
  Filled 2023-08-26: qty 28, 14d supply, fill #1
  Filled 2023-09-27: qty 60, 30d supply, fill #2
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
## 2023-07-18 ENCOUNTER — Ambulatory Visit: Payer: MEDICAID | Attending: Neurosurgery | Admitting: Physical Therapy

## 2023-07-18 ENCOUNTER — Other Ambulatory Visit: Payer: Self-pay

## 2023-07-18 ENCOUNTER — Encounter: Payer: Self-pay | Admitting: Physical Therapy

## 2023-07-18 DIAGNOSIS — R262 Difficulty in walking, not elsewhere classified: Secondary | ICD-10-CM | POA: Insufficient documentation

## 2023-07-18 DIAGNOSIS — R269 Unspecified abnormalities of gait and mobility: Secondary | ICD-10-CM | POA: Diagnosis present

## 2023-07-18 DIAGNOSIS — R2689 Other abnormalities of gait and mobility: Secondary | ICD-10-CM | POA: Diagnosis present

## 2023-07-18 DIAGNOSIS — M542 Cervicalgia: Secondary | ICD-10-CM | POA: Diagnosis present

## 2023-07-18 DIAGNOSIS — M4712 Other spondylosis with myelopathy, cervical region: Secondary | ICD-10-CM | POA: Diagnosis not present

## 2023-07-18 DIAGNOSIS — M6281 Muscle weakness (generalized): Secondary | ICD-10-CM | POA: Insufficient documentation

## 2023-07-19 ENCOUNTER — Other Ambulatory Visit: Payer: Self-pay

## 2023-07-23 ENCOUNTER — Ambulatory Visit: Payer: MEDICAID | Admitting: Physical Therapy

## 2023-07-23 DIAGNOSIS — M542 Cervicalgia: Secondary | ICD-10-CM | POA: Diagnosis not present

## 2023-07-23 DIAGNOSIS — M6281 Muscle weakness (generalized): Secondary | ICD-10-CM

## 2023-07-23 DIAGNOSIS — R2689 Other abnormalities of gait and mobility: Secondary | ICD-10-CM
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
## 2023-07-24 ENCOUNTER — Telehealth: Payer: Self-pay

## 2023-07-24 NOTE — Telephone Encounter (Signed)
I was contact by Stephanie Holland, CMA with [facility]. She reports that Stephanie Holland was seen in their clinic yesterday for bilateral lumbar medial branch blocks. They could not give her valium because of her substance abuse history. The patient had a difficult time tolerating the procedure. During the follow up phone call with Stephanie Holland today, she is not able to report how she felt yesterday evening, but states her back feels great today.  They are recommending if she needs further injections that she be put under anesthesia for these.   I informed Stephanie Holland that I will discuss this with Dr Katrinka Blazing, but that we have been seeing her for her neck, not her back. She has been seeing Dr Rosita Kea for her back. It appears the patient has contacted Dr Neomia Glass office to discuss this.  I have discussed the above with Dr Katrinka Blazing. She is already scheduled to follow up with Dr Katrinka Blazing on 08/12/23.
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
## 2023-08-02 ENCOUNTER — Ambulatory Visit: Payer: MEDICAID | Attending: Neurosurgery | Admitting: Physical Therapy

## 2023-08-02 DIAGNOSIS — R2689 Other abnormalities of gait and mobility: Secondary | ICD-10-CM | POA: Insufficient documentation

## 2023-08-02 DIAGNOSIS — M542 Cervicalgia: Secondary | ICD-10-CM | POA: Insufficient documentation

## 2023-08-02 DIAGNOSIS — M6281 Muscle weakness (generalized): Secondary | ICD-10-CM | POA: Insufficient documentation

## 2023-08-02 DIAGNOSIS — R269 Unspecified abnormalities of gait and mobility: Secondary | ICD-10-CM | POA: Diagnosis present

## 2023-08-02 DIAGNOSIS — R262 Difficulty in walking, not elsewhere classified: Secondary | ICD-10-CM | POA: Insufficient documentation
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
## 2023-08-06 ENCOUNTER — Ambulatory Visit: Payer: MEDICAID

## 2023-08-06 DIAGNOSIS — R2689 Other abnormalities of gait and mobility: Secondary | ICD-10-CM

## 2023-08-06 DIAGNOSIS — M542 Cervicalgia: Secondary | ICD-10-CM | POA: Diagnosis not present

## 2023-08-06 DIAGNOSIS — R262 Difficulty in walking, not elsewhere classified: Secondary | ICD-10-CM

## 2023-08-06 DIAGNOSIS — R269 Unspecified abnormalities of gait and mobility: Secondary | ICD-10-CM

## 2023-08-06 DIAGNOSIS — M6281 Muscle weakness (generalized): Secondary | ICD-10-CM

## 2023-08-09 ENCOUNTER — Ambulatory Visit: Payer: MEDICAID

## 2023-08-09 DIAGNOSIS — M542 Cervicalgia: Secondary | ICD-10-CM | POA: Diagnosis not present

## 2023-08-09 DIAGNOSIS — R2689 Other abnormalities of gait and mobility: Secondary | ICD-10-CM

## 2023-08-09 DIAGNOSIS — M6281 Muscle weakness (generalized): Secondary | ICD-10-CM
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
## 2023-08-12 ENCOUNTER — Ambulatory Visit: Payer: MEDICAID

## 2023-08-12 ENCOUNTER — Ambulatory Visit (INDEPENDENT_AMBULATORY_CARE_PROVIDER_SITE_OTHER): Payer: MEDICAID | Admitting: Neurosurgery

## 2023-08-12 ENCOUNTER — Encounter: Payer: Self-pay | Admitting: Neurosurgery

## 2023-08-12 VITALS — BP 130/77 | Ht 66.0 in | Wt 207.0 lb

## 2023-08-12 DIAGNOSIS — R269 Unspecified abnormalities of gait and mobility: Secondary | ICD-10-CM

## 2023-08-12 DIAGNOSIS — M5442 Lumbago with sciatica, left side: Secondary | ICD-10-CM | POA: Diagnosis not present

## 2023-08-12 DIAGNOSIS — M5441 Lumbago with sciatica, right side: Secondary | ICD-10-CM | POA: Diagnosis not present

## 2023-08-12 DIAGNOSIS — M4722 Other spondylosis with radiculopathy, cervical region: Secondary | ICD-10-CM

## 2023-08-12 DIAGNOSIS — R2689 Other abnormalities of gait and mobility: Secondary | ICD-10-CM

## 2023-08-12 DIAGNOSIS — M542 Cervicalgia: Secondary | ICD-10-CM

## 2023-08-12 DIAGNOSIS — G8929 Other chronic pain: Secondary | ICD-10-CM | POA: Insufficient documentation

## 2023-08-12 DIAGNOSIS — M6281 Muscle weakness (generalized): Secondary | ICD-10-CM

## 2023-08-12 DIAGNOSIS — M4712 Other spondylosis with myelopathy, cervical region: Secondary | ICD-10-CM

## 2023-08-12 DIAGNOSIS — R262 Difficulty in walking, not elsewhere classified: Secondary | ICD-10-CM

## 2023-08-12 NOTE — Progress Notes (Signed)
Referring Physician:  Sandrea Hughs, NP [address] [place],  [place] [address]  Primary Physician:  Sandrea Hughs, NP  History of Present Illness: 08/12/2023 Ms. Mikele Sifuentes is here today with a chief complaint of neck pain, radiculopathy, back pain, and lumbar radiculopathy.  She states that since she was seen last her neck pain has been continually worsening.  It is in her neck and bilateral shoulders.  Mostly runs down her left arm but can also run down her right arm.  She feels like she is dropping things intermittently.  She has not noticed any acute changes in her strength or sensation.  She does feel that her back pain has worsened significantly.  She has a history of chronic back pain as well as chronic sciatica.  This goes from her back and radiates down her legs.  She states that it is worse while she is upright or active.  She states that if she lays recumbent or stays off of her feet that she can be comfortable.  Otherwise she has significant pain and if she is there is her self this can last for many days.  She is continuing to see her psychiatrist, therapist, support group, and her sponsor.  She is continuing to work with physical therapy.  She has had previous cervical spinal injections which gave her very limited results.  She had a previous EMG which demonstrated a left-sided cervical radiculopathy.  This also showed carpal tunnel bilaterally, and has recently had a carpal tunnel release in June of this year.  06/12/2023: Left C5-6 transforaminal ESI   Past Surgery:   carpal tunnel release on 04/23/2023 in [place].   Review of Systems:  A 10 point review of systems is negative, except for the pertinent positives and negatives detailed in the HPI.  Past Medical History: Past Medical History:  Diagnosis Date   Alcohol abuse    Bipolar 1 disorder, depressed (HCC)    IBS (irritable bowel syndrome)    Insomnia    Manic depression (HCC)    Motion sickness     cars    Past Surgical History: Past Surgical History:  Procedure Laterality Date   APPENDECTOMY     CARPAL TUNNEL RELEASE Right 04/23/2023   Procedure: CARPAL TUNNEL RELEASE;  Surgeon: Kennedy Bucker, MD;  Location: [facility];  Service: Orthopedics;  Laterality: Right;   COLONOSCOPY WITH PROPOFOL N/A 03/15/2023   Procedure: COLONOSCOPY WITH PROPOFOL;  Surgeon: Midge Minium, MD;  Location: [facility];  Service: Endoscopy;  Laterality: N/A;    Allergies: Allergies as of 08/12/2023 - Review Complete 08/12/2023  Allergen Reaction Noted   Lactose intolerance (gi) Nausea Only 04/16/2023   Hydrocodone Itching and Rash 04/16/2023    Medications:  Current Outpatient Medications:    APPLE CIDER VINEGAR PO, Take by mouth daily., Disp: , Rfl:    ARIPiprazole (ABILIFY) 5 MG tablet, Take 1 tablet (5 mg total) by mouth daily., Disp: 30 tablet, Rfl: 1   pregabalin (LYRICA) 75 MG capsule, Take 1 capsule (75 mg total) by mouth 2 (two) times daily., Disp: 60 capsule, Rfl: 2   sertraline (ZOLOFT) 100 MG tablet, Take 2 tablets (200 mg total) by mouth daily., Disp: 60 tablet, Rfl: 1   traZODone (DESYREL) 100 MG tablet, Take 1.5 tablets (150 mg total) by mouth at bedtime as needed., Disp: 45 tablet, Rfl: 0  Social History: Social History   Tobacco Use   Smoking status: Never   Smokeless tobacco: Never  Vaping Use   Vaping status: Never Used  Substance Use Topics   Alcohol use: Not Currently    Comment: quit 07/19/2022   Drug use: Not Currently    Types: Cocaine, Methamphetamines, "Crack" cocaine    Comment: quit 07/19/2022    Family Medical History: Family History  Problem Relation Age of Onset   Heart disease Mother    Heart disease Father     Physical Examination: Vitals:   08/12/23 0904  BP: 130/77    General: Patient is appearing to be in pain and is quite tearful attention to examination is appropriate.  Neck:   Limited active range of motion especially in  extension.  Exacerbates her symptoms.  Respiratory: Patient is breathing without any difficulty.   NEUROLOGICAL:     Awake, alert, oriented to person, place, and time.  Speech is clear and fluent.   Cranial Nerves: Pupils equal round and reactive to light.  Facial tone is symmetric. Shoulder shrug is symmetric. Tongue protrusion is midline.  There is no pronator drift.  Motor Exam: Motor examination is limited by pain.  She shows some moderate changes in her wrist extension bilaterally, but is otherwise at least 4+ with no major deficits or muscle wasting.  Her lower extremity strength is quite limited to her back pain but is at least 4-4+ throughout without any major noticed deficits.  Reflexes are decreased throughout.  Left upper extremity demonstrates decreased sensation in the C6 dermatome when compared to the contralateral side.     Gait is antalgic   Medical Decision Making  Imaging: Narrative & Impression  CLINICAL DATA:  Initial evaluation for neck pain with right hand numbness.   EXAM: MRI CERVICAL SPINE WITHOUT CONTRAST   TECHNIQUE: Multiplanar, multisequence MR imaging of the cervical spine was performed. No intravenous contrast was administered.   COMPARISON:  Radiograph from 01/24/2023.   FINDINGS: Alignment: Straightening with mild reversal of the normal cervical lordosis. No listhesis.   Vertebrae: Vertebral body height maintained without acute or chronic fracture. Bone marrow signal intensity within normal limits. No discrete or worrisome osseous lesions. No abnormal marrow edema.   Cord: Normal signal and morphology.   Posterior Fossa, vertebral arteries, paraspinal tissues: Unremarkable.   Disc levels:   C2-C3: Normal interspace. Mild bilateral facet hypertrophy. No canal or foraminal stenosis.   C3-C4: Right eccentric disc osteophyte complex flattens and partially faces the ventral thecal sac. Superimposed mild right-sided facet hypertrophy.  Resultant mild canal with moderate to severe right C4 foraminal stenosis. Left neural foramen remains patent.   C4-C5: Mild disc bulge with uncovertebral spurring. Flattening and partial effacement of the ventral thecal sac. Mild right-sided facet hypertrophy. Resultant mild spinal stenosis with moderate right C5 foraminal narrowing. Left neural foramina remains adequately patent.   C5-C6: Degenerative intervertebral disc space narrowing with diffuse disc osteophyte complex. Broad posterior component flattens and effaces the ventral thecal sac. Moderate spinal stenosis with severe right worse than left C6 foraminal narrowing.   C6-C7: Disc bulge with uncovertebral spurring. No spinal stenosis. Foramina remain patent.   C7-T1: Normal interspace. Mild facet hypertrophy. No canal or foraminal stenosis.   IMPRESSION: 1. Multilevel cervical spondylosis with resultant mild to moderate spinal stenosis at C3-4 through C5-6. 2. Multifactorial degenerative changes with resultant multilevel foraminal narrowing as above. Notable findings include moderate to severe right C4 and C5 foraminal stenosis, with severe right worse than left C6 foraminal narrowing.     Electronically Signed   By: Janell Quiet.D.  On: 06/03/2023 05:00         Narrative & Impression  CLINICAL DATA:  Evaluate for excess motion. Cervical spondylosis with myelopathy. Cervicalgia. Posterior neck pain with numbness in both arms and hands.   EXAM: CERVICAL SPINE - COMPLETE 4+ VIEW   COMPARISON:  None Available.   FINDINGS: Four views of the cervical spine. No evidence of cervical spine fracture or prevertebral soft tissue swelling. Alignment is normal. No dynamic listhesis on flexion-extension views. Multilevel disc height loss, greatest at C5-6.   IMPRESSION: Multilevel disc height loss, greatest at C5-6. No dynamic listhesis on flexion-extension views.     Electronically Signed   By:  Orvan Falconer M.D.   On: 07/16/2023 10:45      Electrodiagnostics: Jolene Provost, MD - 06/26/2023 9:15 AM EDT Associated Order(s): EMG PROCEDURE REQUEST Formatting of this note is different from the original. Images from the original note were not included. Test Date: 06/26/2023  Patient: Lauralye Kinn DOB: Nov 17, 1965 Physician: Dr. Cristopher Peru Chart#: [id number] Sex: Female Ref Phys: Dr. Rosita Kea  Patient History: Patient is a 57 year-old female who presents with bilateral hand numbness, tingling, pain. Dropping objects. Has neck pain that does radiate into the arms and hands. History of alcohol abuse over 35 years. 1 year sobber. 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 Right hand carpal tunnel release done in June 2024 but did not improve symptoms.  Exam: Right carpal tunnel syndrome scar. Parasthesia in both hands. Pain in left hand joints. Strength 5/5. DTR 1+.  EMG & NCV Findings: Evaluation of the Left median sensory nerve showed no response (Wrist). The Right median sensory nerve showed prolonged distal peak latency (5.1 ms) and decreased conduction velocity (Wrist-2nd Digit, 25 m/s). The Left median/ulnar (palm) comparison nerve showed no response (Median Palm). The Right median/ulnar (palm) comparison nerve showed prolonged distal peak latency (Median Palm, 3.2 ms) and abnormal peak latency difference (Median Palm-Ulnar Palm, 1.8 ms). All remaining nerves (as indicated in the following tables) were within normal limits.  EMG  Side Muscle Nerve Root Ins Act Fibs Psw Amp Dur Poly Recrt Int Dennie Bible Comment Right Abd Poll Brev Median C8-T1 Nml Nml Nml Nml Nml 0 Nml Nml Left 1stDorInt Ulnar C8-T1 Nml Nml Nml Nml Nml 0 Nml Nml Left ExtDigCom Radial (Post Int) C7-8 Nml Nml Nml Nml Nml 0 Nml Nml Left Triceps Radial C6-7-8 Nml Nml Nml Nml Nml 0 Nml Nml Left Deltoid Axillary C5-6 Nml Nml Nml Nml Nml 0 Nml Nml Left Cervical Parasp Up Rami C1-3 Nml Nml Nml Left Cervical  Parasp Mid Rami C4-6 Nml Nml Nml Left Cervical Parasp Low Rami C7-8 Nml Nml Nml Right 1stDorInt Ulnar C8-T1 Nml Nml Nml Nml Nml 0 Nml Nml Right ExtDigCom Radial (Post Int) C7-8 Nml Nml Nml Nml Nml 0 Nml Nml Right Triceps Radial C6-7-8 Nml Nml Nml Nml Nml 0 Nml Nml Right Deltoid Axillary C5-6 Nml Nml Nml Nml Nml 0 Nml Nml Right PronatorTeres Median C6-7 Nml Nml Nml Incr >15ms 1+ mild Reduced Nml Right Biceps Musculocut C5-6 Nml Nml Nml Incr >13ms 1+ mild Reduced Nml Right Cervical Parasp Up Rami C1-3 Nml Nml Nml Right Cervical Parasp Mid Rami C4-6 Nml Nml Nml Right Cervical Parasp Low Rami C7-8 Nml Nml Nml  Impression: This is an abnormal electrodiagnostic exam consistent with 1)bilateral mild (grade II) carpal tunnel syndrome (median nerve entrapment at wrist). 2) with chronic C6 radiculopathy on the left.  Thank you for the  referral of this patient. It was our privilege to participate in care of your patient. Feel free to contact us with any further questions.  _____________________________ Cristopher Peru, M.D.   I have personally reviewed the images and electrodiagnostics and agree with the above interpretation.  Assessment and Plan: Ms. Longsworth is a pleasant 57 y.o. female with left-sided radiculopathy and neck pain/cervicogenic headaches.  She has been dealing with this for many years but had a worsening exacerbation within the past year and especially within the past 6 to 9 months.  She has been working with physical therapy and has had previous cervical spine injections.  Her flexion-extension x-rays did not show any dynamic listhesis but did show a significant amount of disc height loss and spondylosis which was also noted on her MRI.  This does cause compression of her bilateral C6 nerve roots and with an EMG demonstrating a C6 radiculopathy she could potentially benefit from a anterior cervical discectomy and fusion.  We discussed this with her however she states that her back is her main  complaint at this point and would like to make sure that we work that up as well.  In regards to her low back is worsened with exacerbation or being upright.  It improves with recumbency.  She has had extensive physical therapy.  She has had a history of back pain for many years.  Her MRI of her low back does show some spondylitic changes with some joint effusions noted in her lumbar spine.  Would like to check for any dynamic listhesis or major sagittal deformity that may not be apparent on her recumbent x-rays.  Will plan for upright scoliosis films and lumbar flexion-extension x-rays to evaluate for changes.  We like to follow-up with her after these x-rays are evaluated.  We can discuss possible cervical decompression and fusion to help with her neck pain and bilateral upper arm pain left worse than right, should she have a spinal deformity or mobile spondylolisthesis we could discuss her lumbar spine at that point.  Thank you for involving me in the care of this patient.   Lovenia Kim MD/MSCR Neurosurgery
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
## 2023-08-13 ENCOUNTER — Ambulatory Visit: Payer: MEDICAID

## 2023-08-15 ENCOUNTER — Ambulatory Visit: Payer: MEDICAID | Admitting: Physical Therapy

## 2023-08-15 DIAGNOSIS — R2689 Other abnormalities of gait and mobility: Secondary | ICD-10-CM

## 2023-08-15 DIAGNOSIS — M542 Cervicalgia: Secondary | ICD-10-CM

## 2023-08-15 DIAGNOSIS — R269 Unspecified abnormalities of gait and mobility: Secondary | ICD-10-CM

## 2023-08-15 DIAGNOSIS — R262 Difficulty in walking, not elsewhere classified: Secondary | ICD-10-CM

## 2023-08-15 DIAGNOSIS — M6281 Muscle weakness (generalized): Secondary | ICD-10-CM
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
## 2023-08-19 ENCOUNTER — Ambulatory Visit
Admission: RE | Admit: 2023-08-19 | Discharge: 2023-08-19 | Disposition: A | Discharge status: Home / Self Care | Payer: MEDICAID | Source: Ambulatory Visit | Attending: Neurosurgery | Admitting: Neurosurgery

## 2023-08-19 ENCOUNTER — Ambulatory Visit: Payer: MEDICAID | Admitting: Physical Therapy

## 2023-08-19 DIAGNOSIS — M542 Cervicalgia: Secondary | ICD-10-CM | POA: Diagnosis present

## 2023-08-19 DIAGNOSIS — M5442 Lumbago with sciatica, left side: Secondary | ICD-10-CM | POA: Diagnosis present

## 2023-08-19 DIAGNOSIS — R2689 Other abnormalities of gait and mobility: Secondary | ICD-10-CM

## 2023-08-19 DIAGNOSIS — G8929 Other chronic pain: Secondary | ICD-10-CM | POA: Insufficient documentation

## 2023-08-19 DIAGNOSIS — M4712 Other spondylosis with myelopathy, cervical region: Secondary | ICD-10-CM

## 2023-08-19 DIAGNOSIS — R269 Unspecified abnormalities of gait and mobility: Secondary | ICD-10-CM

## 2023-08-19 DIAGNOSIS — R262 Difficulty in walking, not elsewhere classified: Secondary | ICD-10-CM

## 2023-08-19 DIAGNOSIS — M6281 Muscle weakness (generalized): Secondary | ICD-10-CM

## 2023-08-19 DIAGNOSIS — M5441 Lumbago with sciatica, right side: Secondary | ICD-10-CM | POA: Diagnosis present
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
## 2023-08-20 ENCOUNTER — Other Ambulatory Visit: Payer: Self-pay

## 2023-08-22 ENCOUNTER — Other Ambulatory Visit: Payer: Self-pay

## 2023-08-23 ENCOUNTER — Ambulatory Visit: Payer: MEDICAID | Admitting: Physical Therapy

## 2023-08-23 DIAGNOSIS — M542 Cervicalgia: Secondary | ICD-10-CM | POA: Diagnosis not present

## 2023-08-23 DIAGNOSIS — M6281 Muscle weakness (generalized): Secondary | ICD-10-CM
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
## 2023-08-26 ENCOUNTER — Other Ambulatory Visit: Payer: Self-pay

## 2023-08-28 ENCOUNTER — Ambulatory Visit (INDEPENDENT_AMBULATORY_CARE_PROVIDER_SITE_OTHER): Payer: MEDICAID | Admitting: Neurosurgery

## 2023-08-28 ENCOUNTER — Ambulatory Visit: Payer: MEDICAID | Admitting: Physical Therapy

## 2023-08-28 DIAGNOSIS — M6281 Muscle weakness (generalized): Secondary | ICD-10-CM

## 2023-08-28 DIAGNOSIS — R269 Unspecified abnormalities of gait and mobility: Secondary | ICD-10-CM

## 2023-08-28 DIAGNOSIS — R262 Difficulty in walking, not elsewhere classified: Secondary | ICD-10-CM

## 2023-08-28 DIAGNOSIS — M542 Cervicalgia: Secondary | ICD-10-CM | POA: Diagnosis not present

## 2023-08-28 DIAGNOSIS — R2689 Other abnormalities of gait and mobility: Secondary | ICD-10-CM

## 2023-08-28 DIAGNOSIS — M4722 Other spondylosis with radiculopathy, cervical region: Secondary | ICD-10-CM

## 2023-08-28 DIAGNOSIS — M4712 Other spondylosis with myelopathy, cervical region: Secondary | ICD-10-CM

## 2023-08-28 NOTE — Progress Notes (Signed)
I had a follow-up phone call today with Stephanie Holland.  She was at home and I was in the office.  She gave consent to go forward with a phone visit.  We had a follow-up on her x-rays which demonstrated age-appropriate alignment of her spine overall without any massive deformities.  We again discussed her cervical and lumbar spine issues.  Currently her neck pain is with bothering her the most.  She gets bilateral radiating issues into her shoulders and arms.  This worsens with neck movements.  The last time I saw her we discussed that she has significant spondylosis at C5-6 with a EMG finding of a C6 radiculopathy.  Her symptoms continue to be in this distribution.  We do think that she would be a candidate for C5-6 cervical arthroplasty to help restore her disc height, decompress her C6 nerves, and maintain mobility in her neck.  We discussed that since her symptoms have been so longstanding that we would not expect immediate relief and that her overall recovery could be somewhat limited given the chronicity of her symptoms.  However she does have significant compression at this level with electrodiagnostic findings consistent with a cervical radiculopathy.  We spent 15 minutes on the phone discussing her case.
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
## 2023-08-29 ENCOUNTER — Telehealth: Payer: Self-pay | Admitting: Neurosurgery

## 2023-08-29 NOTE — Telephone Encounter (Signed)
Patient notified of message below. Patient voiced understanding.  Lovenia Kim, MD  You; [facility]1 hour ago (1:08 PM)   I can't, she'll have to get from her PCP if she wants anyting more than what she can get over the counter.

## 2023-08-29 NOTE — Telephone Encounter (Signed)
Patient forgot to ask Dr.Smith yesterday if he could prescribe her something for her increased headaches to help her get through to her surgery date. [facility]

## 2023-08-30 ENCOUNTER — Ambulatory Visit: Payer: MEDICAID | Admitting: Physical Therapy

## 2023-08-30 ENCOUNTER — Other Ambulatory Visit: Payer: Self-pay

## 2023-08-30 ENCOUNTER — Telehealth: Payer: Self-pay

## 2023-08-30 ENCOUNTER — Encounter: Payer: Self-pay | Admitting: Neurosurgery

## 2023-08-30 DIAGNOSIS — M4712 Other spondylosis with myelopathy, cervical region: Secondary | ICD-10-CM

## 2023-08-30 DIAGNOSIS — M5010 Cervical disc disorder with radiculopathy, unspecified cervical region: Secondary | ICD-10-CM | POA: Insufficient documentation

## 2023-08-30 DIAGNOSIS — M542 Cervicalgia: Secondary | ICD-10-CM

## 2023-08-30 MED ORDER — TRAZODONE HCL 100 MG PO TABS
150.0000 mg | ORAL_TABLET | Freq: Every evening | ORAL | 1 refills | Status: DC | PRN
  Filled 2023-08-30: qty 45, 30d supply, fill #0
  Filled 2023-09-27: qty 45, 30d supply, fill #1

## 2023-08-30 MED ORDER — SERTRALINE HCL 100 MG PO TABS
200.0000 mg | ORAL_TABLET | Freq: Every day | ORAL | 1 refills | Status: DC
  Filled 2023-08-30: qty 60, 30d supply, fill #0
  Filled 2023-09-27 (×2): qty 60, 30d supply, fill #1

## 2023-08-30 MED ORDER — ARIPIPRAZOLE 5 MG PO TABS
5.0000 mg | ORAL_TABLET | Freq: Every day | ORAL | 1 refills | Status: DC
  Filled 2023-08-30: qty 30, 30d supply, fill #0

## 2023-08-30 MED ORDER — TRAZODONE HCL 100 MG PO TABS
150.0000 mg | ORAL_TABLET | Freq: Every evening | ORAL | 1 refills | Status: DC | PRN
  Filled 2023-09-27 (×3): qty 45, 30d supply, fill #0
  Filled 2023-10-24: qty 45, 30d supply, fill #1

## 2023-08-30 MED ORDER — SERTRALINE HCL 100 MG PO TABS: 200.0000 mg | ORAL_TABLET | Freq: Every day | ORAL | 2 refills | Status: DC

## 2023-08-30 NOTE — Telephone Encounter (Signed)
Planned surgery: C5-6 arthroplasty   Surgery date: 10/03/23 at [facility]: [address], [place], [place] [address]) - you will find out your arrival time the business day before your surgery.   Pre-op appointment at [facility] Pre-admit Testing: we will call you with a date/time for this. If you are scheduled for an in person appointment, Pre-admit Testing is located on the first floor of the [facility] building, [address], [address]. Please bring all prescriptions in the original prescription bottles to your appointment. During this appointment, they will advise you which medications you can take the morning of surgery, and which medications you will need to hold for surgery. Labs (such as blood work, EKG) may be done at your pre-op appointment. You are not required to fast for these labs. Should you need to change your pre-op appointment, please call Pre-admit testing at [phone].      Surgical clearance: we will send a clearance form to your primary provider, Sandrea Hughs, NP. They may wish to see you in their office prior to signing the clearance form. If so, they may call you to schedule an appointment.    Common restrictions after surgery: No bending, lifting, or twisting ("BLT"). Avoid lifting objects heavier than 10 pounds for the first 6 weeks after surgery. Where possible, avoid household activities that involve lifting, bending, reaching, pushing, or pulling such as laundry, vacuuming, grocery shopping, and childcare. Try to arrange for help from friends and family for these activities while you heal. Do not drive while taking prescription pain medication. Weeks 6 through 12 after surgery: avoid lifting more than 25 pounds.    X-rays after surgery: Because you are having an arthroplasty: for appointments after your 2 week follow-up: please arrive at the [facility] outpatient imaging center ([address], [address], [place]) or [facility] one hour prior to your appointment for x-rays. This applies to every appointment after your 2 week follow-up. Failure to do so may result in your appointment being rescheduled.   How to contact us:  If you have any questions/concerns before or after surgery, you can reach [place] at [phone], or you can send a mychart message. We can be reached by phone or mychart 8am-4pm, Monday-Friday.  *Please note: Calls after 4pm are forwarded to a third party answering service. Mychart messages are not routinely monitored during evenings, weekends, and holidays. Please call our office to contact the answering service for urgent concerns during non-business hours.   If you have FMLA/disability paperwork, please drop it off or fax it to [phone], attention Patty.   Appointments/FMLA & disability paperwork: Joycelyn Rua, & Flonnie Hailstone Registered Nurse/Surgery scheduler: Royston Cowper Medical Assistants: Nash Mantis Physician Assistants: Manning Charity, PA-C & Drake Leach, PA-C Surgeons: Venetia Night, MD & Ernestine Mcmurray, MD

## 2023-09-02 NOTE — Telephone Encounter (Signed)
Patient has called the office wanting to discuss more details about scheduling the surgery. States since she is living in a women's recovery house she has to make sure these dates work with her daughter as well. Just wanting to discuss these things over with the nurse and would like a call back at the earliest convenience.

## 2023-09-03 ENCOUNTER — Other Ambulatory Visit: Payer: Self-pay

## 2023-09-03 ENCOUNTER — Encounter: Payer: MEDICAID | Admitting: Physical Therapy

## 2023-09-03 DIAGNOSIS — Z01818 Encounter for other preprocedural examination: Secondary | ICD-10-CM

## 2023-09-03 NOTE — Telephone Encounter (Signed)
10/03/2023 The recovery house needs to know how long will she be on pain medication and how she will be taking it because they will do not allow opioids.

## 2023-09-04 NOTE — Telephone Encounter (Signed)
I spoke with Stephanie Holland and relayed Stephanie Holland's response. I also clarified that her surgery is on 12/5 and her other appointments she is referring to in her mychart messages are before and after surgery. She is agreeable to all we discussed.

## 2023-09-06 ENCOUNTER — Ambulatory Visit: Payer: MEDICAID | Admitting: Physical Therapy

## 2023-09-10 ENCOUNTER — Ambulatory Visit: Payer: MEDICAID | Admitting: Physical Therapy

## 2023-09-12 ENCOUNTER — Encounter: Payer: MEDICAID | Admitting: Physical Therapy

## 2023-09-20 ENCOUNTER — Encounter
Admission: RE | Admit: 2023-09-20 | Discharge: 2023-09-20 | Disposition: A | Discharge status: Home / Self Care | Payer: MEDICAID | Source: Ambulatory Visit | Attending: Neurosurgery | Admitting: Neurosurgery

## 2023-09-20 VITALS — BP 110/79 | HR 81 | Resp 14 | Ht 66.0 in | Wt 214.1 lb

## 2023-09-20 DIAGNOSIS — Z01812 Encounter for preprocedural laboratory examination: Secondary | ICD-10-CM | POA: Diagnosis present

## 2023-09-20 DIAGNOSIS — Z01818 Encounter for other preprocedural examination: Secondary | ICD-10-CM

## 2023-09-20 HISTORY — DX: Anxiety disorder, unspecified: F41.9

## 2023-09-20 HISTORY — DX: Obesity, unspecified: E66.9

## 2023-09-20 HISTORY — DX: Depression, unspecified: F32.A

## 2023-09-20 HISTORY — DX: Cocaine abuse, in remission: F14.11

## 2023-09-20 HISTORY — DX: Radiculopathy, cervical region: M54.12

## 2023-09-20 HISTORY — DX: Other chronic pain: G89.29

## 2023-09-20 HISTORY — DX: Spondylosis without myelopathy or radiculopathy, cervical region: M47.812

## 2023-09-20 HISTORY — DX: Gastro-esophageal reflux disease without esophagitis: K21.9

## 2023-09-20 HISTORY — DX: Dyspnea, unspecified: R06.00

## 2023-09-20 HISTORY — DX: Migraine, unspecified, not intractable, without status migrainosus: G43.909

## 2023-09-20 HISTORY — DX: Family history of other specified conditions: Z84.89

## 2023-09-20 LAB — TYPE AND SCREEN
ABO/RH(D): A POS
Antibody Screen: NEGATIVE

## 2023-09-20 LAB — SURGICAL PCR SCREEN
MRSA, PCR: NEGATIVE
Staphylococcus aureus: POSITIVE — AB

## 2023-09-20 NOTE — Patient Instructions (Addendum)
Your procedure is scheduled on:10-03-23 Thursday Report to the Registration Desk on the 1st floor of the [facility].Then proceed to the 2nd floor Surgery Desk To find out your arrival time, please call [phone] between 1PM - 3PM on:10-02-23 Wednesday If your arrival time is 6:00 am, do not arrive before that time as the [facility] entrance doors do not open until 6:00 am.  REMEMBER: Instructions that are not followed completely may result in serious medical risk, up to and including death; or upon the discretion of your surgeon and anesthesiologist your surgery may need to be rescheduled.  Do not eat food after midnight the night before surgery.  No gum chewing or hard candies.  You may however, drink CLEAR liquids up to 2 hours before you are scheduled to arrive for your surgery. Do not drink anything within 2 hours of your scheduled arrival time.  Clear liquids include: - water  - apple juice without pulp - gatorade (not RED colors) - black coffee or tea (Do NOT add milk or creamers to the coffee or tea) Do NOT drink anything that is not on this list.  One week prior to surgery:Stop on 09-26-23 Stop ANY OVER THE COUNTER supplements/vitamins until after surgery (Apple Cider Vinegar)  Continue taking all of your other prescription medications up until the day of surgery.  Do NOT take any medication the day of surgery  No Alcohol for 24 hours before or after surgery.  No Smoking including e-cigarettes for 24 hours before surgery.  No chewable tobacco products for at least 6 hours before surgery.  No nicotine patches on the day of surgery.  Do not use any "recreational" drugs for at least a week (preferably 2 weeks) before your surgery.  Please be advised that the combination of cocaine and anesthesia may have negative outcomes, up to and including death. If you test positive for cocaine, your surgery will be cancelled.  On the morning of surgery brush your teeth with  toothpaste and water, you may rinse your mouth with mouthwash if you wish. Do not swallow any toothpaste or mouthwash.  Use CHG Soap as directed on instruction sheet.  Do not wear jewelry, make-up, hairpins, clips or nail polish.  For welded (permanent) jewelry: bracelets, anklets, waist bands, etc.  Please have this removed prior to surgery.  If it is not removed, there is a chance that hospital personnel will need to cut it off on the day of surgery.  Do not wear lotions, powders, or perfumes.   Do not shave body hair from the neck down 48 hours before surgery.  Contact lenses, hearing aids and dentures may not be worn into surgery.  Do not bring valuables to the hospital. [facility] is not responsible for any missing/lost belongings or valuables.   Notify your doctor if there is any change in your medical condition (cold, fever, infection).  Wear comfortable clothing (specific to your surgery type) to the hospital.  After surgery, you can help prevent lung complications by doing breathing exercises.  Take deep breaths and cough every 1-2 hours. Your doctor may order a device called an Incentive Spirometer to help you take deep breaths. When coughing or sneezing, hold a pillow firmly against your incision with both hands. This is called "splinting." Doing this helps protect your incision. It also decreases belly discomfort.  If you are being admitted to the hospital overnight, leave your suitcase in the car. After surgery it may be brought to your room.  In case of increased patient census, it may be necessary for you, the patient, to continue your postoperative care in the Same Day Surgery department.  If you are being discharged the day of surgery, you will not be allowed to drive home. You will need a responsible individual to drive you home and stay with you for 24 hours after surgery.   If you are taking public transportation, you will need to have a responsible individual  with you.  Please call the Pre-admissions Testing Dept. at [phone] if you have any questions about these instructions.  Surgery Visitation Policy:  Patients having surgery or a procedure may have two visitors.  Children under the age of 83 must have an adult with them who is not the patient.  Inpatient Visitation:    Visiting hours are 7 a.m. to 8 p.m. Up to four visitors are allowed at one time in a patient room. The visitors may rotate out with other people during the day.  One visitor age 5 or older may stay with the patient overnight and must be in the room by 8 p.m.    Pre-operative 5 CHG Bath Instructions   You can play a key role in reducing the risk of infection after surgery. Your skin needs to be as free of germs as possible. You can reduce the number of germs on your skin by washing with CHG (chlorhexidine gluconate) soap before surgery. CHG is an antiseptic soap that kills germs and continues to kill germs even after washing.   DO NOT use if you have an allergy to chlorhexidine/CHG or antibacterial soaps. If your skin becomes reddened or irritated, stop using the CHG and notify one of our RNs at [phone].   Please shower with the CHG soap starting 4 days before surgery using the following schedule:     Please keep in mind the following:  DO NOT shave, including legs and underarms, starting the day of your first shower.   You may shave your face at any point before/day of surgery.  Place clean sheets on your bed the day you start using CHG soap. Use a clean washcloth (not used since being washed) for each shower. DO NOT sleep with pets once you start using the CHG.   CHG Shower Instructions:  If you choose to wash your hair and private area, wash first with your normal shampoo/soap.  After you use shampoo/soap, rinse your hair and body thoroughly to remove shampoo/soap residue.  Turn the water OFF and apply about 3 tablespoons (45 ml) of CHG soap to a CLEAN  washcloth.  Apply CHG soap ONLY FROM YOUR NECK DOWN TO YOUR TOES (washing for 3-5 minutes)  DO NOT use CHG soap on face, private areas, open wounds, or sores.  Pay special attention to the area where your surgery is being performed.  If you are having back surgery, having someone wash your back for you may be helpful. Wait 2 minutes after CHG soap is applied, then you may rinse off the CHG soap.  Pat dry with a clean towel  Put on clean clothes/pajamas   If you choose to wear lotion, please use ONLY the CHG-compatible lotions on the back of this paper.     Additional instructions for the day of surgery: DO NOT APPLY any lotions, deodorants, cologne, or perfumes.   Put on clean/comfortable clothes.  Brush your teeth.  Ask your nurse before applying any prescription medications to the skin.  CHG Compatible Lotions   Aveeno Moisturizing lotion  Cetaphil Moisturizing Cream  Cetaphil Moisturizing Lotion  Clairol Herbal Essence Moisturizing Lotion, Dry Skin  Clairol Herbal Essence Moisturizing Lotion, Extra Dry Skin  Clairol Herbal Essence Moisturizing Lotion, Normal Skin  Curel Age Defying Therapeutic Moisturizing Lotion with Alpha Hydroxy  Curel Extreme Care Body Lotion  Curel Soothing Hands Moisturizing Hand Lotion  Curel Therapeutic Moisturizing Cream, Fragrance-Free  Curel Therapeutic Moisturizing Lotion, Fragrance-Free  Curel Therapeutic Moisturizing Lotion, Original Formula  Eucerin Daily Replenishing Lotion  Eucerin Dry Skin Therapy Plus Alpha Hydroxy Crme  Eucerin Dry Skin Therapy Plus Alpha Hydroxy Lotion  Eucerin Original Crme  Eucerin Original Lotion  Eucerin Plus Crme Eucerin Plus Lotion  Eucerin TriLipid Replenishing Lotion  Keri Anti-Bacterial Hand Lotion  Keri Deep Conditioning Original Lotion Dry Skin Formula Softly Scented  Keri Deep Conditioning Original Lotion, Fragrance Free Sensitive Skin Formula  Keri Lotion Fast Absorbing Fragrance Free Sensitive  Skin Formula  Keri Lotion Fast Absorbing Softly Scented Dry Skin Formula  Keri Original Lotion  Keri Skin Renewal Lotion Keri Silky Smooth Lotion  Keri Silky Smooth Sensitive Skin Lotion  Nivea Body Creamy Conditioning Oil  Nivea Body Extra Enriched Teacher, adult education Moisturizing Lotion Nivea Crme  Nivea Skin Firming Lotion  NutraDerm 30 Skin Lotion  NutraDerm Skin Lotion  NutraDerm Therapeutic Skin Cream  NutraDerm Therapeutic Skin Lotion  ProShield Protective Hand Cream  Provon moisturizing lotion

## 2023-09-23 ENCOUNTER — Telehealth: Payer: MEDICAID | Admitting: Neurosurgery

## 2023-09-24 ENCOUNTER — Ambulatory Visit (INDEPENDENT_AMBULATORY_CARE_PROVIDER_SITE_OTHER): Payer: MEDICAID | Admitting: Neurosurgery

## 2023-09-24 DIAGNOSIS — M50122 Cervical disc disorder at C5-C6 level with radiculopathy: Secondary | ICD-10-CM

## 2023-09-24 DIAGNOSIS — M5010 Cervical disc disorder with radiculopathy, unspecified cervical region: Secondary | ICD-10-CM | POA: Insufficient documentation

## 2023-09-24 NOTE — Progress Notes (Signed)
I had a follow-up phone visit with Stephanie Holland today.  She was at home and I was in the office.  She gave consent to go forward with phone visit.  She wanted to discuss the risks and benefits of surgery again.  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  She has tried conservative management but has not had any improvement.  We offered her a C5-6 cervical arthroplasty to help with treatment of a C6 radiculopathy.  She had some questions about risks of bleeding.  We let her know that bleeding, infection, CSF leak, nerve injury, spinal cord injury, injury to the nearby structures including the esophagus, trachea, blood vessels are all possible during a anterior approach to the cervical spine.  We discussed the possibility of a C5 palsy.  We discussed the possibility for an adequate resolution of her symptoms and need for further surgery.  We also discussed the risks of undergoing any anesthetic for spine surgery as well.  She acknowledged these risks.  Stated that her pinched nerve sensation is incredibly severe and that she would like to go forward with the procedure.  She plans to return for signing of the consent form.  We spent 8 minutes on the phone discussing her care.  Lovenia Kim, MD [facility]

## 2023-09-27 ENCOUNTER — Other Ambulatory Visit: Payer: Self-pay

## 2023-10-02 MED ORDER — ORAL CARE MOUTH RINSE: 15.0000 mL | Freq: Once | OROMUCOSAL | Status: AC

## 2023-10-02 MED ORDER — CEFAZOLIN IN SODIUM CHLORIDE 2-0.9 GM/100ML-% IV SOLN
2.0000 g | Freq: Once | INTRAVENOUS | Status: DC
Start: 2023-10-02 — End: 2023-10-02
  Filled 2023-10-02: qty 100

## 2023-10-02 MED ORDER — LACTATED RINGERS IV SOLN: INTRAVENOUS | Status: DC

## 2023-10-02 MED ORDER — CEFAZOLIN SODIUM-DEXTROSE 2-4 GM/100ML-% IV SOLN
2.0000 g | INTRAVENOUS | Status: AC
  Administered 2023-10-03: 2 g via INTRAVENOUS

## 2023-10-02 MED ORDER — CHLORHEXIDINE GLUCONATE 0.12 % MT SOLN
15.0000 mL | Freq: Once | OROMUCOSAL | Status: AC
  Administered 2023-10-03: 15 mL via OROMUCOSAL

## 2023-10-03 ENCOUNTER — Encounter: Payer: Self-pay | Admitting: Neurosurgery

## 2023-10-03 ENCOUNTER — Ambulatory Visit: Payer: MEDICAID

## 2023-10-03 ENCOUNTER — Ambulatory Visit: Payer: MEDICAID | Admitting: Urgent Care

## 2023-10-03 ENCOUNTER — Encounter
Admission: RE | Disposition: A | Discharge status: Home / Self Care | Payer: Self-pay | Source: Home / Self Care | Attending: Neurosurgery

## 2023-10-03 ENCOUNTER — Other Ambulatory Visit: Payer: Self-pay

## 2023-10-03 ENCOUNTER — Ambulatory Visit
Admission: RE | Admit: 2023-10-03 | Discharge: 2023-10-04 | Disposition: A | Discharge status: Home / Self Care | Payer: MEDICAID | Attending: Neurosurgery | Admitting: Neurosurgery

## 2023-10-03 DIAGNOSIS — M5010 Cervical disc disorder with radiculopathy, unspecified cervical region: Secondary | ICD-10-CM | POA: Insufficient documentation

## 2023-10-03 DIAGNOSIS — M5416 Radiculopathy, lumbar region: Secondary | ICD-10-CM | POA: Diagnosis not present

## 2023-10-03 DIAGNOSIS — M4722 Other spondylosis with radiculopathy, cervical region: Secondary | ICD-10-CM | POA: Diagnosis not present

## 2023-10-03 DIAGNOSIS — M50122 Cervical disc disorder at C5-C6 level with radiculopathy: Secondary | ICD-10-CM | POA: Diagnosis not present

## 2023-10-03 DIAGNOSIS — M4712 Other spondylosis with myelopathy, cervical region: Secondary | ICD-10-CM | POA: Insufficient documentation

## 2023-10-03 DIAGNOSIS — M5000 Cervical disc disorder with myelopathy, unspecified cervical region: Secondary | ICD-10-CM | POA: Diagnosis not present

## 2023-10-03 DIAGNOSIS — F319 Bipolar disorder, unspecified: Secondary | ICD-10-CM | POA: Diagnosis not present

## 2023-10-03 DIAGNOSIS — M542 Cervicalgia: Secondary | ICD-10-CM | POA: Diagnosis present

## 2023-10-03 DIAGNOSIS — Z01818 Encounter for other preprocedural examination: Secondary | ICD-10-CM

## 2023-10-03 DIAGNOSIS — M4802 Spinal stenosis, cervical region: Secondary | ICD-10-CM | POA: Insufficient documentation

## 2023-10-03 DIAGNOSIS — M5412 Radiculopathy, cervical region: Secondary | ICD-10-CM | POA: Diagnosis present

## 2023-10-03 HISTORY — PX: CERVICAL DISC ARTHROPLASTY: SHX587

## 2023-10-03 LAB — ABO/RH: ABO/RH(D): A POS

## 2023-10-03 SURGERY — CERVICAL ANTERIOR DISC ARTHROPLASTY
Anesthesia: General

## 2023-10-03 MED ORDER — ONDANSETRON HCL 4 MG/2ML IJ SOLN: 4.0000 mg | Freq: Four times a day (QID) | INTRAMUSCULAR | Status: DC | PRN

## 2023-10-03 MED ORDER — CYCLOBENZAPRINE HCL 10 MG PO TABS
10.0000 mg | ORAL_TABLET | Freq: Three times a day (TID) | ORAL | Status: DC | PRN
  Administered 2023-10-03 – 2023-10-04 (×2): 10 mg via ORAL
  Filled 2023-10-03 (×6): qty 1

## 2023-10-03 MED ORDER — ACETAMINOPHEN 500 MG PO TABS
1000.0000 mg | ORAL_TABLET | Freq: Four times a day (QID) | ORAL | Status: AC
  Administered 2023-10-03 – 2023-10-04 (×2): 1000 mg via ORAL

## 2023-10-03 MED ORDER — MIDAZOLAM HCL 2 MG/2ML IJ SOLN
INTRAMUSCULAR | Status: AC
  Filled 2023-10-03: qty 2

## 2023-10-03 MED ORDER — CEFAZOLIN SODIUM-DEXTROSE 2-4 GM/100ML-% IV SOLN
INTRAVENOUS | Status: AC
  Filled 2023-10-03: qty 100

## 2023-10-03 MED ORDER — ACETAMINOPHEN 10 MG/ML IV SOLN
1000.0000 mg | Freq: Once | INTRAVENOUS | Status: DC | PRN
  Administered 2023-10-03: 1000 mg via INTRAVENOUS

## 2023-10-03 MED ORDER — ACETAMINOPHEN 500 MG PO TABS
ORAL_TABLET | ORAL | Status: AC
  Filled 2023-10-03: qty 2

## 2023-10-03 MED ORDER — SODIUM CHLORIDE 0.9 % IV SOLN: INTRAVENOUS | Status: DC

## 2023-10-03 MED ORDER — ONDANSETRON HCL 4 MG/2ML IJ SOLN
INTRAMUSCULAR | Status: DC | PRN
  Administered 2023-10-03: 4 mg via INTRAVENOUS

## 2023-10-03 MED ORDER — TRAZODONE HCL 50 MG PO TABS
150.0000 mg | ORAL_TABLET | Freq: Every day | ORAL | Status: DC
  Administered 2023-10-03: 150 mg via ORAL
  Filled 2023-10-03: qty 1

## 2023-10-03 MED ORDER — BUPIVACAINE-EPINEPHRINE (PF) 0.5% -1:200000 IJ SOLN
INTRAMUSCULAR | Status: DC | PRN
  Administered 2023-10-03: 6 mL via PERINEURAL

## 2023-10-03 MED ORDER — SERTRALINE HCL 50 MG PO TABS
ORAL_TABLET | ORAL | Status: AC
  Filled 2023-10-03: qty 4

## 2023-10-03 MED ORDER — LIDOCAINE HCL (CARDIAC) PF 100 MG/5ML IV SOSY
PREFILLED_SYRINGE | INTRAVENOUS | Status: DC | PRN
  Administered 2023-10-03: 100 mg via INTRAVENOUS

## 2023-10-03 MED ORDER — SENNA 8.6 MG PO TABS
ORAL_TABLET | ORAL | Status: AC
  Filled 2023-10-03: qty 1

## 2023-10-03 MED ORDER — OXYCODONE HCL 5 MG PO TABS
5.0000 mg | ORAL_TABLET | ORAL | Status: DC | PRN
  Administered 2023-10-03: 5 mg via ORAL

## 2023-10-03 MED ORDER — SODIUM CHLORIDE 0.9 % IV SOLN: 250.0000 mL | INTRAVENOUS | Status: DC

## 2023-10-03 MED ORDER — EPHEDRINE SULFATE-NACL 50-0.9 MG/10ML-% IV SOSY
PREFILLED_SYRINGE | INTRAVENOUS | Status: DC | PRN
  Administered 2023-10-03 (×2): 5 mg via INTRAVENOUS

## 2023-10-03 MED ORDER — FENTANYL CITRATE (PF) 100 MCG/2ML IJ SOLN
INTRAMUSCULAR | Status: AC
  Filled 2023-10-03: qty 2

## 2023-10-03 MED ORDER — SUCCINYLCHOLINE CHLORIDE 200 MG/10ML IV SOSY
PREFILLED_SYRINGE | INTRAVENOUS | Status: DC | PRN
  Administered 2023-10-03: 120 mg via INTRAVENOUS

## 2023-10-03 MED ORDER — OXYCODONE HCL 5 MG PO TABS
ORAL_TABLET | ORAL | Status: AC
  Filled 2023-10-03: qty 1

## 2023-10-03 MED ORDER — PHENOL 1.4 % MT LIQD
1.0000 | OROMUCOSAL | Status: DC | PRN
  Administered 2023-10-03: 1 via OROMUCOSAL
  Filled 2023-10-03: qty 177

## 2023-10-03 MED ORDER — PHENYLEPHRINE 80 MCG/ML (10ML) SYRINGE FOR IV PUSH (FOR BLOOD PRESSURE SUPPORT)
PREFILLED_SYRINGE | INTRAVENOUS | Status: DC | PRN
  Administered 2023-10-03 (×3): 80 ug via INTRAVENOUS

## 2023-10-03 MED ORDER — ONDANSETRON HCL 4 MG/2ML IJ SOLN
INTRAMUSCULAR | Status: AC
  Filled 2023-10-03: qty 2

## 2023-10-03 MED ORDER — OXYCODONE HCL 5 MG PO TABS
10.0000 mg | ORAL_TABLET | ORAL | Status: DC | PRN
  Administered 2023-10-03: 10 mg via ORAL

## 2023-10-03 MED ORDER — SURGIFLO WITH THROMBIN (HEMOSTATIC MATRIX KIT) OPTIME
TOPICAL | Status: DC | PRN
  Administered 2023-10-03: 1 via TOPICAL

## 2023-10-03 MED ORDER — DOCUSATE SODIUM 100 MG PO CAPS
ORAL_CAPSULE | ORAL | Status: AC
  Filled 2023-10-03: qty 1

## 2023-10-03 MED ORDER — DEXAMETHASONE SODIUM PHOSPHATE 10 MG/ML IJ SOLN
INTRAMUSCULAR | Status: DC | PRN
  Administered 2023-10-03: 10 mg via INTRAVENOUS

## 2023-10-03 MED ORDER — ACETAMINOPHEN 325 MG PO TABS: 650.0000 mg | ORAL_TABLET | ORAL | Status: DC | PRN

## 2023-10-03 MED ORDER — ARIPIPRAZOLE 5 MG PO TABS
5.0000 mg | ORAL_TABLET | Freq: Every day | ORAL | Status: DC
  Filled 2023-10-03 (×3): qty 1

## 2023-10-03 MED ORDER — ACETAMINOPHEN 10 MG/ML IV SOLN
INTRAVENOUS | Status: AC
  Filled 2023-10-03: qty 100

## 2023-10-03 MED ORDER — PROPOFOL 10 MG/ML IV BOLUS
INTRAVENOUS | Status: AC
  Filled 2023-10-03: qty 20

## 2023-10-03 MED ORDER — PROPOFOL 10 MG/ML IV BOLUS
INTRAVENOUS | Status: DC | PRN
  Administered 2023-10-03: 50 mg via INTRAVENOUS

## 2023-10-03 MED ORDER — ROCURONIUM BROMIDE 100 MG/10ML IV SOLN
INTRAVENOUS | Status: DC | PRN
  Administered 2023-10-03: 40 mg via INTRAVENOUS
  Administered 2023-10-03: 20 mg via INTRAVENOUS

## 2023-10-03 MED ORDER — MENTHOL 3 MG MT LOZG: 1.0000 | LOZENGE | OROMUCOSAL | Status: DC | PRN

## 2023-10-03 MED ORDER — SUGAMMADEX SODIUM 200 MG/2ML IV SOLN
INTRAVENOUS | Status: DC | PRN
  Administered 2023-10-03: 194.2 mg via INTRAVENOUS

## 2023-10-03 MED ORDER — DEXAMETHASONE SODIUM PHOSPHATE 10 MG/ML IJ SOLN
INTRAMUSCULAR | Status: AC
Start: 2023-10-03 — End: ?
  Filled 2023-10-03: qty 1

## 2023-10-03 MED ORDER — DOCUSATE SODIUM 100 MG PO CAPS
100.0000 mg | ORAL_CAPSULE | Freq: Two times a day (BID) | ORAL | Status: DC
  Administered 2023-10-03 – 2023-10-04 (×2): 100 mg via ORAL

## 2023-10-03 MED ORDER — ONDANSETRON HCL 4 MG/2ML IJ SOLN
4.0000 mg | Freq: Once | INTRAMUSCULAR | Status: AC | PRN
  Administered 2023-10-03: 4 mg via INTRAVENOUS

## 2023-10-03 MED ORDER — PHENYLEPHRINE 80 MCG/ML (10ML) SYRINGE FOR IV PUSH (FOR BLOOD PRESSURE SUPPORT)
PREFILLED_SYRINGE | INTRAVENOUS | Status: AC
  Filled 2023-10-03: qty 10

## 2023-10-03 MED ORDER — CEFAZOLIN SODIUM-DEXTROSE 2-4 GM/100ML-% IV SOLN
2.0000 g | Freq: Four times a day (QID) | INTRAVENOUS | Status: AC
  Administered 2023-10-03 (×2): 2 g via INTRAVENOUS

## 2023-10-03 MED ORDER — FENTANYL CITRATE (PF) 100 MCG/2ML IJ SOLN
25.0000 ug | INTRAMUSCULAR | Status: DC | PRN
  Administered 2023-10-03 (×2): 50 ug via INTRAVENOUS

## 2023-10-03 MED ORDER — SODIUM CHLORIDE 0.9% FLUSH: 3.0000 mL | Freq: Two times a day (BID) | INTRAVENOUS | Status: DC

## 2023-10-03 MED ORDER — MIDAZOLAM HCL 2 MG/2ML IJ SOLN
INTRAMUSCULAR | Status: DC | PRN
  Administered 2023-10-03: 2 mg via INTRAVENOUS

## 2023-10-03 MED ORDER — ACETAMINOPHEN 650 MG RE SUPP: 650.0000 mg | RECTAL | Status: DC | PRN

## 2023-10-03 MED ORDER — ROCURONIUM BROMIDE 10 MG/ML (PF) SYRINGE
PREFILLED_SYRINGE | INTRAVENOUS | Status: AC
  Filled 2023-10-03: qty 10

## 2023-10-03 MED ORDER — EPHEDRINE 5 MG/ML INJ
INTRAVENOUS | Status: AC
  Filled 2023-10-03: qty 5

## 2023-10-03 MED ORDER — SODIUM CHLORIDE 0.9% FLUSH: 3.0000 mL | INTRAVENOUS | Status: DC | PRN

## 2023-10-03 MED ORDER — CHLORHEXIDINE GLUCONATE 0.12 % MT SOLN
OROMUCOSAL | Status: AC
Start: 2023-10-03 — End: ?
  Filled 2023-10-03: qty 15

## 2023-10-03 MED ORDER — SENNA 8.6 MG PO TABS
1.0000 | ORAL_TABLET | Freq: Two times a day (BID) | ORAL | Status: DC
  Administered 2023-10-03 – 2023-10-04 (×2): 8.6 mg via ORAL

## 2023-10-03 MED ORDER — OXYCODONE HCL 5 MG PO TABS
ORAL_TABLET | ORAL | Status: AC
  Filled 2023-10-03: qty 2

## 2023-10-03 MED ORDER — SERTRALINE HCL 50 MG PO TABS
200.0000 mg | ORAL_TABLET | Freq: Every day | ORAL | Status: DC
  Administered 2023-10-03: 200 mg via ORAL

## 2023-10-03 MED ORDER — ONDANSETRON HCL 4 MG PO TABS: 4.0000 mg | ORAL_TABLET | Freq: Four times a day (QID) | ORAL | Status: DC | PRN

## 2023-10-03 MED ORDER — FENTANYL CITRATE (PF) 100 MCG/2ML IJ SOLN
INTRAMUSCULAR | Status: DC | PRN
  Administered 2023-10-03 (×4): 50 ug via INTRAVENOUS

## 2023-10-03 MED ORDER — 0.9 % SODIUM CHLORIDE (POUR BTL) OPTIME
TOPICAL | Status: DC | PRN
  Administered 2023-10-03: 500 mL

## 2023-10-03 SURGICAL SUPPLY — 37 items
BUR NEURO DRILL SOFT 3.0X3.8M (BURR) ×1 IMPLANT
DERMABOND ADVANCED .7 DNX12 (GAUZE/BANDAGES/DRESSINGS) ×1 IMPLANT
DISC MOBI-C CERVICAL 13X17 H5 (Miscellaneous) IMPLANT
DRAPE LAPAROTOMY 77X122 PED (DRAPES) ×1 IMPLANT
DRAPE MICROSCOPE SPINE 48X150 (DRAPES) ×1 IMPLANT
DRAPE SURG 17X11 SM STRL (DRAPES) ×1 IMPLANT
ELECT CAUTERY BLADE TIP 2.5 (TIP) ×1
ELECT REM PT RETURN 9FT ADLT (ELECTROSURGICAL) ×1
ELECTRODE CAUTERY BLDE TIP 2.5 (TIP) ×1 IMPLANT
ELECTRODE REM PT RTRN 9FT ADLT (ELECTROSURGICAL) ×1 IMPLANT
GLOVE BIOGEL PI IND STRL 6.5 (GLOVE) ×1 IMPLANT
GLOVE BIOGEL PI IND STRL 8 (GLOVE) ×3 IMPLANT
GLOVE BIOGEL PI IND STRL 8.5 (GLOVE) ×2 IMPLANT
GLOVE SURG SYN 6.5 ES PF (GLOVE) ×1 IMPLANT
GLOVE SURG SYN 6.5 PF PI (GLOVE) ×1 IMPLANT
GLOVE SURG SYN 7.5 E (GLOVE) ×3 IMPLANT
GLOVE SURG SYN 7.5 PF PI (GLOVE) ×1 IMPLANT
GLOVE SURG SYN 8.5 E (GLOVE) ×3 IMPLANT
GLOVE SURG SYN 8.5 PF PI (GLOVE) ×3 IMPLANT
GOWN SRG LRG LVL 4 IMPRV REINF (GOWNS) ×1 IMPLANT
GOWN SRG XL LVL 3 NONREINFORCE (GOWNS) ×1 IMPLANT
KIT TURNOVER KIT A (KITS) ×1 IMPLANT
MANIFOLD NEPTUNE II (INSTRUMENTS) ×1 IMPLANT
MARKER SKIN DUAL TIP RULER LAB (MISCELLANEOUS) ×2 IMPLANT
NS IRRIG 500ML POUR BTL (IV SOLUTION) IMPLANT
PACK LAMINECTOMY ARMC (PACKS) ×1 IMPLANT
PAD ARMBOARD 7.5X6 YLW CONV (MISCELLANEOUS) ×1 IMPLANT
PIN CASPAR SPINAL 12MM (PIN) IMPLANT
SPONGE KITTNER 5P (MISCELLANEOUS) ×1 IMPLANT
SURGIFLO W/THROMBIN 8M KIT (HEMOSTASIS) ×1 IMPLANT
SUT STRATA 3-0 15 PS-2 (SUTURE) IMPLANT
SUT VICRYL 3-0 CR8 SH (SUTURE) ×1 IMPLANT
SYR 30ML LL (SYRINGE) ×1 IMPLANT
TAPE CLOTH 3X10 WHT NS LF (GAUZE/BANDAGES/DRESSINGS) ×1 IMPLANT
TOWEL OR 17X26 4PK STRL BLUE (TOWEL DISPOSABLE) ×3 IMPLANT
TRAP FLUID SMOKE EVACUATOR (MISCELLANEOUS) ×1 IMPLANT
TUBING CONNECTING 10 (TUBING) ×1 IMPLANT

## 2023-10-03 NOTE — Interval H&P Note (Signed)
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  The various methods of treatment have been discussed with the patient and family. After consideration of risks, benefits and other options for treatment, the patient has consented to  Procedure(s): C5-6 ARTHROPLASTY (N/A) as a surgical intervention.  The patient's history has been reviewed, patient examined, no change in status, stable for surgery.  I have reviewed the patient's chart and labs.  Questions were answered to the patient's satisfaction.    Heart and Lungs Clear  Lovenia Kim

## 2023-10-03 NOTE — Anesthesia Procedure Notes (Signed)
Procedure Name: Intubation Date/Time: 10/03/2023 10:26 AM  Performed by: Emeterio Reeve, CRNAPre-anesthesia Checklist: Patient identified, Emergency Drugs available, Suction available and Patient being monitored Patient Re-evaluated:Patient Re-evaluated prior to induction Oxygen Delivery Method: Circle system utilized Preoxygenation: Pre-oxygenation with 100% oxygen Induction Type: IV induction Ventilation: Mask ventilation without difficulty Laryngoscope Size: McGrath and 4 Tube type: Oral Tube size: 7.0 mm Number of attempts: 1 Airway Equipment and Method: Stylet and Oral airway Placement Confirmation: ETT inserted through vocal cords under direct vision, positive ETCO2 and breath sounds checked- equal and bilateral Secured at: 21 cm Tube secured with: Tape Dental Injury: Teeth and Oropharynx as per pre-operative assessment  Comments: Cords clear; lungs CTAB. CA

## 2023-10-03 NOTE — H&P (Signed)
Referring Physician:  No referring provider defined for this encounter.  Primary Physician:  Sandrea Hughs, NP  History of Present Illness: 10/03/2023 Ms. Cotrina Danzey is here today with a chief complaint of neck pain, radiculopathy, back pain, and lumbar radiculopathy.  She states that since she was seen last her neck pain has been continually worsening.  It is in her neck and bilateral shoulders.  Mostly runs down her left arm but can also run down her right arm.  She feels like she is dropping things intermittently.  She has not noticed any acute changes in her strength or sensation.  She is continuing to see her psychiatrist, therapist, support group, and her sponsor.  She is continuing to work with physical therapy.  She has had previous cervical spinal injections which gave her very limited results.  She had a previous EMG which demonstrated a left-sided cervical radiculopathy.  This also showed carpal tunnel bilaterally, and has recently had a carpal tunnel release in June of this year.  06/12/2023: Left C5-6 transforaminal ESI   Past Surgery:   carpal tunnel release on 04/23/2023 in [place].   Review of Systems:  A 10 point review of systems is negative, except for the pertinent positives and negatives detailed in the HPI.  Past Medical History: Past Medical History:  Diagnosis Date   Alcohol abuse    sober since 06-2022   Anxiety    Bipolar 1 disorder, depressed (HCC)    C6 radiculopathy    Cervical spondylosis    Chronic low back pain with bilateral sciatica    Cocaine abuse in remission (HCC)    clean since 06-2022   Depression    Dyspnea    Family history of adverse reaction to anesthesia    mom-delayed emergence   GERD (gastroesophageal reflux disease)    rare-no meds   IBS (irritable bowel syndrome)    Insomnia    Manic depression (HCC)    Migraine    Motion sickness    cars   Obesity     Past Surgical History: Past Surgical History:  Procedure  Laterality Date   APPENDECTOMY     age 64   CARPAL TUNNEL RELEASE Right 04/23/2023   Procedure: CARPAL TUNNEL RELEASE;  Surgeon: Kennedy Bucker, MD;  Location: [facility];  Service: Orthopedics;  Laterality: Right;   COLONOSCOPY WITH PROPOFOL N/A 03/15/2023   Procedure: COLONOSCOPY WITH PROPOFOL;  Surgeon: Midge Minium, MD;  Location: [facility];  Service: Endoscopy;  Laterality: N/A;    Allergies: Allergies as of 09/03/2023 - Review Complete 08/12/2023  Allergen Reaction Noted   Lactose intolerance (gi) Nausea Only 04/16/2023   Hydrocodone Itching and Rash 04/16/2023    Medications:  Current Facility-Administered Medications:    ceFAZolin (ANCEF) IVPB 2g/100 mL premix, 2 g, Intravenous, 60 min Pre-Op, Katrinka Blazing, Jana Half, MD   lactated ringers infusion, , Intravenous, Continuous, Louie Boston, MD  Social History: Social History   Tobacco Use   Smoking status: Never   Smokeless tobacco: Never  Vaping Use   Vaping status: Never Used  Substance Use Topics   Alcohol use: Not Currently    Comment: quit 07/19/2022   Drug use: Not Currently    Types: Cocaine, Methamphetamines, "Crack" cocaine    Comment: quit 07/19/2022    Family Medical History: Family History  Problem Relation Age of Onset   Heart disease Mother    Heart disease Father     Physical Examination: Vitals:   10/03/23  0759  BP: 125/68  Pulse: 88  Resp: 16  Temp: (!) 97.5 F (36.4 C)  SpO2: 98%    General: Patient is appearing to be in pain and is quite tearful attention to examination is appropriate.  Neck:   Limited active range of motion especially in extension.  Exacerbates her symptoms.  Respiratory: Patient is breathing without any difficulty.   NEUROLOGICAL:     Awake, alert, oriented to person, place, and time.  Speech is clear and fluent.   Cranial Nerves: Pupils equal round and reactive to light.  Facial tone is symmetric. Shoulder shrug is symmetric. Tongue protrusion  is midline.  There is no pronator drift.  Motor Exam: Motor examination is limited by pain.  She shows some moderate changes in her wrist extension bilaterally, but is otherwise at least 4+ with no major deficits or muscle wasting.  Her lower extremity strength is quite limited to her back pain but is at least 4-4+ throughout without any major noticed deficits.  Reflexes are decreased throughout.  Left upper extremity demonstrates decreased sensation in the C6 dermatome when compared to the contralateral side.     Gait is antalgic   Medical Decision Making  Imaging: Narrative & Impression  CLINICAL DATA:  Initial evaluation for neck pain with right hand numbness.   EXAM: MRI CERVICAL SPINE WITHOUT CONTRAST   TECHNIQUE: Multiplanar, multisequence MR imaging of the cervical spine was performed. No intravenous contrast was administered.   COMPARISON:  Radiograph from 01/24/2023.   FINDINGS: Alignment: Straightening with mild reversal of the normal cervical lordosis. No listhesis.   Vertebrae: Vertebral body height maintained without acute or chronic fracture. Bone marrow signal intensity within normal limits. No discrete or worrisome osseous lesions. No abnormal marrow edema.   Cord: Normal signal and morphology.   Posterior Fossa, vertebral arteries, paraspinal tissues: Unremarkable.   Disc levels:   C2-C3: Normal interspace. Mild bilateral facet hypertrophy. No canal or foraminal stenosis.   C3-C4: Right eccentric disc osteophyte complex flattens and partially faces the ventral thecal sac. Superimposed mild right-sided facet hypertrophy. Resultant mild canal with moderate to severe right C4 foraminal stenosis. Left neural foramen remains patent.   C4-C5: Mild disc bulge with uncovertebral spurring. Flattening and partial effacement of the ventral thecal sac. Mild right-sided facet hypertrophy. Resultant mild spinal stenosis with moderate right C5 foraminal  narrowing. Left neural foramina remains adequately patent.   C5-C6: Degenerative intervertebral disc space narrowing with diffuse disc osteophyte complex. Broad posterior component flattens and effaces the ventral thecal sac. Moderate spinal stenosis with severe right worse than left C6 foraminal narrowing.   C6-C7: Disc bulge with uncovertebral spurring. No spinal stenosis. Foramina remain patent.   C7-T1: Normal interspace. Mild facet hypertrophy. No canal or foraminal stenosis.   IMPRESSION: 1. Multilevel cervical spondylosis with resultant mild to moderate spinal stenosis at C3-4 through C5-6. 2. Multifactorial degenerative changes with resultant multilevel foraminal narrowing as above. Notable findings include moderate to severe right C4 and C5 foraminal stenosis, with severe right worse than left C6 foraminal narrowing.     Electronically Signed   By: Rise Mu M.D.   On: 06/03/2023 05:00         Narrative & Impression  CLINICAL DATA:  Evaluate for excess motion. Cervical spondylosis with myelopathy. Cervicalgia. Posterior neck pain with numbness in both arms and hands.   EXAM: CERVICAL SPINE - COMPLETE 4+ VIEW   COMPARISON:  None Available.   FINDINGS: Four views of the cervical spine. No  evidence of cervical spine fracture or prevertebral soft tissue swelling. Alignment is normal. No dynamic listhesis on flexion-extension views. Multilevel disc height loss, greatest at C5-6.   IMPRESSION: Multilevel disc height loss, greatest at C5-6. No dynamic listhesis on flexion-extension views.     Electronically Signed   By: Orvan Falconer M.D.   On: 07/16/2023 10:45      Electrodiagnostics: Jolene Provost, MD - 06/26/2023 9:15 AM EDT Associated Order(s): EMG PROCEDURE REQUEST Formatting of this note is different from the original. Images from the original note were not included. Test Date: 06/26/2023  Patient: Jillann Salois DOB:  January 11, 1966 Physician: Dr. Cristopher Peru Chart#: [id number] Sex: Female Ref Phys: Dr. Rosita Kea  Patient History: Patient is a 57 year-old female who presents with bilateral hand numbness, tingling, pain. Dropping objects. Has neck pain that does radiate into the arms and hands. History of alcohol abuse over 35 years. 1 year sobber. 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 Right hand carpal tunnel release done in June 2024 but did not improve symptoms.  Exam: Right carpal tunnel syndrome scar. Parasthesia in both hands. Pain in left hand joints. Strength 5/5. DTR 1+.  EMG & NCV Findings: Evaluation of the Left median sensory nerve showed no response (Wrist). The Right median sensory nerve showed prolonged distal peak latency (5.1 ms) and decreased conduction velocity (Wrist-2nd Digit, 25 m/s). The Left median/ulnar (palm) comparison nerve showed no response (Median Palm). The Right median/ulnar (palm) comparison nerve showed prolonged distal peak latency (Median Palm, 3.2 ms) and abnormal peak latency difference (Median Palm-Ulnar Palm, 1.8 ms). All remaining nerves (as indicated in the following tables) were within normal limits.  EMG  Side Muscle Nerve Root Ins Act Fibs Psw Amp Dur Poly Recrt Int Dennie Bible Comment Right Abd Poll Brev Median C8-T1 Nml Nml Nml Nml Nml 0 Nml Nml Left 1stDorInt Ulnar C8-T1 Nml Nml Nml Nml Nml 0 Nml Nml Left ExtDigCom Radial (Post Int) C7-8 Nml Nml Nml Nml Nml 0 Nml Nml Left Triceps Radial C6-7-8 Nml Nml Nml Nml Nml 0 Nml Nml Left Deltoid Axillary C5-6 Nml Nml Nml Nml Nml 0 Nml Nml Left Cervical Parasp Up Rami C1-3 Nml Nml Nml Left Cervical Parasp Mid Rami C4-6 Nml Nml Nml Left Cervical Parasp Low Rami C7-8 Nml Nml Nml Right 1stDorInt Ulnar C8-T1 Nml Nml Nml Nml Nml 0 Nml Nml Right ExtDigCom Radial (Post Int) C7-8 Nml Nml Nml Nml Nml 0 Nml Nml Right Triceps Radial C6-7-8 Nml Nml Nml Nml Nml 0 Nml Nml Right Deltoid Axillary C5-6 Nml Nml Nml Nml Nml 0 Nml  Nml Right PronatorTeres Median C6-7 Nml Nml Nml Incr >74ms 1+ mild Reduced Nml Right Biceps Musculocut C5-6 Nml Nml Nml Incr >51ms 1+ mild Reduced Nml Right Cervical Parasp Up Rami C1-3 Nml Nml Nml Right Cervical Parasp Mid Rami C4-6 Nml Nml Nml Right Cervical Parasp Low Rami C7-8 Nml Nml Nml  Impression: This is an abnormal electrodiagnostic exam consistent with 1)bilateral mild (grade II) carpal tunnel syndrome (median nerve entrapment at wrist). 2) with chronic C6 radiculopathy on the left.  Thank you for the referral of this patient. It was our privilege to participate in care of your patient. Feel free to contact us with any further questions.  _____________________________ Cristopher Peru, M.D.   I have personally reviewed the images and electrodiagnostics and agree with the above interpretation.  Assessment and Plan: Ms. Postlewaite is a pleasant 57 y.o. female with left-sided radiculopathy and neck  pain/cervicogenic headaches.  She has been dealing with this for many years but had a worsening exacerbation within the past year and especially within the past 6 to 9 months.  She has been working with physical therapy and has had previous cervical spine injections.  Her flexion-extension x-rays did not show any dynamic listhesis but did show a significant amount of disc height loss and spondylosis which was also noted on her MRI.  This does cause compression of her bilateral C6 nerve roots and with an EMG demonstrating a C6 radiculopathy she could potentially benefit from a anterior cervical disc arthroplasty at C5-6.    Risk and benefits were discussed   Lovenia Kim MD/MSCR Neurosurgery

## 2023-10-03 NOTE — Anesthesia Preprocedure Evaluation (Signed)
Anesthesia Evaluation  Patient identified by MRN, date of birth, ID band Patient awake    Reviewed: Allergy & Precautions, NPO status , Patient's Chart, lab work & pertinent test results  History of Anesthesia Complications Negative for: history of anesthetic complications  Airway Mallampati: II  TM Distance: >3 FB Neck ROM: Full    Dental  (+) Teeth Intact   Pulmonary shortness of breath, neg sleep apnea, neg COPD, Patient abstained from smoking.Not current smoker Patient had a cough yesterday, but nothing today. Some mild nasal congestion. Denies fevers/chills/myalgia.  Patient states she may have undiagnosed asthma, has not seen any doctor yet for it.   Pulmonary exam normal breath sounds clear to auscultation       Cardiovascular Exercise Tolerance: Poor METS(-) hypertension+ DOE  (-) CAD and (-) Past MI (-) dysrhythmias  Rhythm:Regular Rate:Normal - Systolic murmurs TTE unremarkable   Neuro/Psych  Headaches PSYCHIATRIC DISORDERS Anxiety Depression Bipolar Disorder    Neuromuscular disease    GI/Hepatic ,GERD  Medicated,,(+)     substance abuse  Patient is a recovering alcoholic/drug user, in rehab, lives in a women's home   Endo/Other  neg diabetes    Renal/GU negative Renal ROS     Musculoskeletal   Abdominal   Peds  Hematology   Anesthesia Other Findings Past Medical History: No date: Alcohol abuse     Comment:  sober since 06-2022 No date: Anxiety No date: Bipolar 1 disorder, depressed (HCC) No date: C6 radiculopathy No date: Cervical spondylosis No date: Chronic low back pain with bilateral sciatica No date: Cocaine abuse in remission [facility])     Comment:  clean since 06-2022 No date: Depression No date: Dyspnea No date: Family history of adverse reaction to anesthesia     Comment:  mom-delayed emergence No date: GERD (gastroesophageal reflux disease)     Comment:  rare-no meds No date: IBS  (irritable bowel syndrome) No date: Insomnia No date: Manic depression (HCC) No date: Migraine No date: Motion sickness     Comment:  cars No date: Obesity  Reproductive/Obstetrics                             Anesthesia Physical Anesthesia Plan  ASA: 2  Anesthesia Plan: General   Post-op Pain Management: Ofirmev IV (intra-op)*   Induction: Intravenous  PONV Risk Score and Plan: 4 or greater and Ondansetron, Dexamethasone, Midazolam, TIVA, Propofol infusion and Treatment may vary due to age or medical condition  Airway Management Planned: Oral ETT and Video Laryngoscope Planned  Additional Equipment: None  Intra-op Plan:   Post-operative Plan: Extubation in OR  Informed Consent: I have reviewed the patients History and Physical, chart, labs and discussed the procedure including the risks, benefits and alternatives for the proposed anesthesia with the patient or authorized representative who has indicated his/her understanding and acceptance.     Dental advisory given  Plan Discussed with: CRNA and Surgeon  Anesthesia Plan Comments: (Talked about patient's mild cold. Given that her cough only lasted a day, she has none today, with only mild congestion, I offered that it would not be unreasonable either way if she decided to proceed or postpone. Discussed risks of anesthesia with patient, including PONV, sore throat, lip/dental/eye damage. Rare risks discussed as well, such as cardiorespiratory and neurological sequelae, and allergic reactions. Discussed potential increased respiratory/upper airway risk given patient's mild URI symptoms. Patient acknowledges this risk, and also acknowledges her social/living situation is tenuous,  and would like to proceed today. Discussed the role of CRNA in patient's perioperative care. Patient understands.)        Anesthesia Quick Evaluation

## 2023-10-03 NOTE — Plan of Care (Signed)
  Problem: Pain Management: Goal: Pain level will decrease Outcome: Progressing   

## 2023-10-03 NOTE — Transfer of Care (Signed)
Immediate Anesthesia Transfer of Care Note  Patient: Stephanie Holland  Procedure(s) Performed: C5-6 ARTHROPLASTY  Patient Location: PACU  Anesthesia Type:General  Level of Consciousness: drowsy and patient cooperative  Airway & Oxygen Therapy: Patient Spontanous Breathing and Patient connected to face mask oxygen  Post-op Assessment: Report given to RN and Post -op Vital signs reviewed and stable  Post vital signs: stable  Last Vitals:  Vitals Value Taken Time  BP 126/69 10/03/23 1245  Temp 35.9 C 10/03/23 1245  Pulse 92 10/03/23 1249  Resp 20 10/03/23 1249  SpO2 100 % 10/03/23 1249  Vitals shown include unfiled device data.  Last Pain:  Vitals:   10/03/23 1245  TempSrc:   PainSc: Asleep         Complications: No notable events documented.

## 2023-10-03 NOTE — Anesthesia Postprocedure Evaluation (Signed)
Anesthesia Post Note  Patient: Stephanie Holland  Procedure(s) Performed: C5-6 ARTHROPLASTY  Patient location during evaluation: PACU Anesthesia Type: General Level of consciousness: awake and alert Pain management: pain level controlled Vital Signs Assessment: post-procedure vital signs reviewed and stable Respiratory status: spontaneous breathing, nonlabored ventilation, respiratory function stable and patient connected to nasal cannula oxygen Cardiovascular status: blood pressure returned to baseline and stable Postop Assessment: no apparent nausea or vomiting Anesthetic complications: no   No notable events documented.   Last Vitals:  Vitals:   10/03/23 1312 10/03/23 1319  BP:    Pulse: 81 78  Resp: (!) 22 (!) 21  Temp:    SpO2: 100% 95%    Last Pain:  Vitals:   10/03/23 1312  TempSrc:   PainSc: 7                  Corinda Gubler

## 2023-10-04 ENCOUNTER — Other Ambulatory Visit: Payer: Self-pay

## 2023-10-04 ENCOUNTER — Encounter: Payer: Self-pay | Admitting: Neurosurgery

## 2023-10-04 DIAGNOSIS — M4802 Spinal stenosis, cervical region: Secondary | ICD-10-CM | POA: Diagnosis not present

## 2023-10-04 DIAGNOSIS — M50122 Cervical disc disorder at C5-C6 level with radiculopathy: Secondary | ICD-10-CM | POA: Diagnosis not present

## 2023-10-04 MED ORDER — SENNA 8.6 MG PO TABS
ORAL_TABLET | ORAL | Status: AC
  Filled 2023-10-04: qty 1

## 2023-10-04 MED ORDER — DOCUSATE SODIUM 100 MG PO CAPS
ORAL_CAPSULE | ORAL | Status: AC
  Filled 2023-10-04: qty 1

## 2023-10-04 MED ORDER — CYCLOBENZAPRINE HCL 10 MG PO TABS
10.0000 mg | ORAL_TABLET | Freq: Three times a day (TID) | ORAL | 0 refills | Status: DC | PRN
  Filled 2023-10-04: qty 90, 30d supply, fill #0

## 2023-10-04 MED ORDER — SENNA 8.6 MG PO TABS
1.0000 | ORAL_TABLET | Freq: Two times a day (BID) | ORAL | 0 refills | Status: DC
  Filled 2023-10-04: qty 100, 50d supply, fill #0

## 2023-10-04 MED ORDER — ACETAMINOPHEN 500 MG PO TABS
ORAL_TABLET | ORAL | Status: AC
  Filled 2023-10-04: qty 2

## 2023-10-04 MED ORDER — OXYCODONE HCL 5 MG PO TABS
5.0000 mg | ORAL_TABLET | ORAL | 0 refills | Status: DC | PRN
  Filled 2023-10-04: qty 35, 3d supply, fill #0

## 2023-10-04 NOTE — Op Note (Addendum)
Indications: Ms. Stephanie Holland is a 57 y.o. female with cervical radiculopathy M54.12.  Findings: Severe bilateral foraminal stenosis secondary to uncovertebral joint hypertrophy and disc bulging.  Well decompressed at the end of the procedure  Preoperative Diagnosis: Cervical Radiculopathy M54.12 Postoperative Diagnosis: same   EBL: 20 ml IVF: See anesthesia report Drains: none Disposition: Extubated and Stable to PACU Complications: none  No foley catheter was placed.   Preoperative Note:   Risks of surgery discussed include: infection, bleeding, stroke, coma, death, paralysis, CSF leak, nerve/spinal cord injury, numbness, tingling, weakness, complex regional pain syndrome, recurrent stenosis and/or disc herniation, vascular injury, development of instability, neck/back pain, need for further surgery, persistent symptoms, development of deformity, and the risks of anesthesia. The patient understood these risks and agreed to proceed.  Operative Note:  Procedure:  1) Cervical Disc Arthroplasty at C5/6 using a LDR Mobi-C device   Procedure: After obtaining informed consent, the patient taken to the operating room, placed in supine position, general anesthesia induced.  The patient had a small shoulder roll placed behind the neck.  The patient received preop antibiotics and IV Decadron.  The patient had a neck incision outlined, was prepped and draped in usual sterile fashion. The incision was injected with local anesthetic.   An incision was opened, dissection taken down medial to the carotid artery and jugular vein, lateral to the trachea and esophagus.  The prevertebral fascia identified and a localizing x-ray demonstrated the correct level.  The longus colli were dissected laterally, and self-retaining retractors placed to open the operative field. The microscope was then brought into the field.  With this complete, distractor pins were placed in the vertebral bodies of C5 and C6. The  distractor was placed, and the annulus at C5/6 was opened using a bovie.  Curettes and pituitary rongeurs used to remove the majority of disk, then the drill was used to remove the posterior osteophyte and begin the foraminotomies. The nerve hook was used to elevate the posterior longitudinal ligament, which was then removed with Kerrison rongeurs. The microblunt nerve hook could be passed out the foramen bilaterally.   Meticulous hemostasis was obtained.  A trial spacer was used to size the disc space. Using flouroscopic guidance, a 17 mm width x 13 mm depth x 5 mm height Mobi-C was then inserted in the prepared disc space.  The caspar distractor was removed, and bone wax used for hemostasis. Final AP and lateral radiographs were taken.   With the disc arthroplasty in good position, the wound was irrigated copiously and meticulous hemostasis obtained.  Wound was closed in 2 layers using interrupted inverted 3-0 Vicryl sutures.  The wound was dressed with dermabond, the head of bed at 30 degrees, taken to recovery room in stable condition.  No new postop neurological deficits were identified.  Sponge and pattie counts were correct at the end of the procedure.     I performed the entire procedure with the assistance of Joan Flores, [facility] as an Designer, television/film set. An assistant was required for this procedure due to the complexity.  The assistant provided assistance in tissue manipulation and suction, and was required for the successful and safe performance of the procedure. I performed the critical portions of the procedure.   Lovenia Kim, MD

## 2023-10-04 NOTE — Plan of Care (Signed)
Documented

## 2023-10-04 NOTE — Discharge Instructions (Signed)
Your surgeon has performed an operation on your cervical spine (neck) to relieve pressure on the spinal cord and/or nerves. This involved making an incision in the front of your neck and removing one or more of the discs that support your spine. Next, a small piece of bone, a titanium plate, and screws were used to fuse two or more of the vertebrae (bones) together.  The following are instructions to help in your recovery once you have been discharged from the hospital. Even if you feel well, it is important that you follow these activity guidelines. If you do not let your neck heal properly from the surgery, you can increase the chance of return of your symptoms and other complications.  * Do not take anti-inflammatory medications for 3 months after surgery (naproxen [Aleve], ibuprofen [Advil, Motrin], etc.). These medications can prevent your bones from healing properly.  Celebrex, if prescribed, is ok to take.  Activity    No bending, lifting, or twisting ("BLT"). Avoid lifting objects heavier than 10 pounds (gallon milk jug).  Where possible, avoid household activities that involve lifting, bending, reaching, pushing, or pulling such as laundry, vacuuming, grocery shopping, and childcare. Try to arrange for help from friends and family for these activities while your back heals.  Increase physical activity slowly as tolerated.  Taking short walks is encouraged, but avoid strenuous exercise. Do not jog, run, bicycle, lift weights, or participate in any other exercises unless specifically allowed by your doctor.  Talk to your doctor before resuming sexual activity.  You should not drive until cleared by your doctor.  Until released by your doctor, you should not return to work or school.  You should rest at home and let your body heal.   You may shower three days after your surgery.  After showering, lightly dab your incision dry. Do not take a tub bath or go swimming until approved by your  doctor at your follow-up appointment.  If your doctor ordered a cervical collar (neck brace) for you, you should wear it whenever you are out of bed. You may remove it when lying down or sleeping, but you should wear it at all other times. Not all neck surgeries require a cervical collar.  If you smoke, we strongly recommend that you quit.  Smoking has been proven to interfere with normal bone healing and will dramatically reduce the success rate of your surgery. Please contact [facility] (800-QUIT-NOW) and use the resources at www.[facility].com for assistance in stopping smoking.  Surgical Incision   If you have a dressing on your incision, you may remove it two days after your surgery. Keep your incision area clean and dry.  If you have staples or stitches on your incision, you should have a follow up scheduled for removal. If you do not have staples or stitches, you will have steri-strips (small pieces of surgical tape) or Dermabond glue. The steri-strips/glue should begin to peel away within about a week (it is fine if the steri-strips fall off before then). If the strips are still in place one week after your surgery, you may gently remove them.  Diet           You may return to your usual diet. However, you may experience discomfort when swallowing in the first month after your surgery. This is normal. You may find that softer foods are more comfortable for you to swallow. Be sure to stay hydrated.  When to Contact Us  You may experience pain in your   neck and/or pain between your shoulder blades. This is normal and should improve in the next few weeks with the help of pain medication, muscle relaxers, and rest. Some patients report that a warm compress on the back of the neck or between the shoulder blades helps.  However, should you experience any of the following, contact us immediately: New numbness or weakness Pain that is progressively getting worse, and is not relieved by your pain  medication, muscle relaxers, rest, and warm compresses Bleeding, redness, swelling, pain, or drainage from surgical incision Chills or flu-like symptoms Fever greater than 101.0 F (38.3 C) Inability to eat, drink fluids, or take medications Problems with bowel or bladder functions Difficulty breathing or shortness of breath Warmth, tenderness, or swelling in your calf Contact Information How to contact us:  If you have any questions/concerns before or after surgery, you can reach [place] at [phone], or you can send a mychart message. We can be reached by phone or mychart 8am-4pm, Monday-Friday.  *Please note: Calls after 4pm are forwarded to a third party answering service. Mychart messages are not routinely monitored during evenings, weekends, and holidays. Please call our office to contact the answering service for urgent concerns during non-business hours.

## 2023-10-04 NOTE — Discharge Summary (Signed)
Discharge Summary  Patient ID: Stephanie Holland MRN: [id number] DOB/AGE: 1966-09-14 57 y.o.  Admit date: 10/03/2023 Discharge date: 10/04/2023  Admission Diagnoses: Cervical radiculopathy  Discharge Diagnoses:  Principal Problem:   Cervical radiculopathy Active Problems:   Spondylosis, cervical, with myelopathy   Cervicalgia   Cervical disc disorder with radiculopathy of cervical region   Cervical disc disorder with radiculopathy   Discharged Condition: good  Hospital Course:  Stephanie Holland is a 57 y.o female presenting with cervical radiculopathy status post C5-6 arthroplasty.  Her intraoperative course was uncomplicated.  She was admitted overnight for pain control.  She reported adequate pain control and improvement of her preoperative radiculopathy.  She was tolerating a normal diet and met appropriate discharge criteria.  She was discharged home on the morning of postop day 1 with prescriptions for oxycodone, Flexeril, and senna to take as needed.  Consults:  none  Significant Diagnostic Studies: NA  Treatments: surgery: as above. Please see separately dictated operative report for further details  Discharge Exam: Blood pressure 114/71, pulse 74, temperature 98.1 F (36.7 C), resp. rate 15, last menstrual period 01/19/2016, SpO2 98%. CN grossly intact MAEW and equally Phonating appropriately.  Trachea is midline. Incision is clean dry and intact with Dermabond in place.  Disposition: Discharge disposition: 01-Home or Self Care       Discharge Instructions     Incentive spirometry RT   Complete by: As directed       Allergies as of 10/04/2023       Reactions   Lactose Intolerance (gi) Nausea Only   Abdominal pain   Hydrocodone Itching, Rash        Medication List     STOP taking these medications    ARIPiprazole 5 MG tablet Commonly known as: ABILIFY   aspirin-acetaminophen-caffeine 250-250-65 MG tablet Commonly known as: EXCEDRIN MIGRAINE        TAKE these medications    APPLE CIDER VINEGAR PO Take 1 tablet by mouth every other day.   cyclobenzaprine 10 MG tablet Commonly known as: FLEXERIL Take 1 tablet (10 mg total) by mouth 3 (three) times daily as needed for muscle spasms.   oxyCODONE 5 MG immediate release tablet Commonly known as: Oxy IR/ROXICODONE Take 1-2 tablets (5-10 mg total) by mouth every 4 (four) hours as needed for moderate pain (pain score 4-6) or severe pain (pain score 7-10).   senna 8.6 MG Tabs tablet Commonly known as: SENOKOT Take 1 tablet (8.6 mg total) by mouth 2 (two) times daily.   sertraline 100 MG tablet Commonly known as: ZOLOFT Take 2 tablets (200 mg total) by mouth daily. What changed:  when to take this Another medication with the same name was removed. Continue taking this medication, and follow the directions you see here.   traZODone 100 MG tablet Commonly known as: DESYREL Take 1.5 tablets (150 mg total) by mouth at bedtime as needed. What changed:  when to take this Another medication with the same name was removed. Continue taking this medication, and follow the directions you see here.        Follow-up Information     Joan Flores, PA-C Follow up on 10/16/2023.   Specialty: Physician Assistant Why: post-op follow up Contact information: [address] [place], [place] [address] [place] [address] [phone]                 Signed: Susanne Borders 10/04/2023, 9:37 AM

## 2023-10-04 NOTE — Progress Notes (Signed)
   Neurosurgery Progress Note  History: Stephanie Holland is here POD 1 s/p C5-6 arthroplasty.  Overall doing well and pain is well controlled.   Physical Exam: Vitals:   10/04/23 0220 10/04/23 0734  BP: 129/80 114/71  Pulse: 92 74  Resp: 16 15  Temp: 98.5 F (36.9 C) 98.1 F (36.7 C)  SpO2: 95% 98%    AA Ox3 CNI  Strength:5/5 throughout     Assessment/Plan:  Stephanie Holland POD 1 s/p C5-6 arthroplasty.  - mobilize - pain control: continue Oxycodone, Tylenol, Robaxin - DVT prophylaxis: TEDs/SCDs  Plan for discharge this morning    Joan Flores PA-C Department of Neurosurgery

## 2023-10-04 NOTE — Plan of Care (Signed)
Patient discharge order received. VSS . No  signs of acute distress. Discharge instructions given and patient verbalized understanding. No other issues noted. Transported in the wheelchair.

## 2023-10-11 ENCOUNTER — Telehealth: Payer: Self-pay | Admitting: Neurosurgery

## 2023-10-11 NOTE — Telephone Encounter (Signed)
I spoke with Stephanie Holland. She reports she is feeling much better than before surgery. She has occasional spasms, but she is not taking oxycodone or flexeril anymore. I advised her that from a surgical standpoint, it is OK for her to drive since she is no longer taking pain medications.

## 2023-10-11 NOTE — Telephone Encounter (Signed)
Patient is calling to see if she is able to drive? She is scheduled for a post op on 10/16/2023. She states that she only took pain medication two days following surgery and that she has not taken the Flexeril as it gives her migraines. Please advise.

## 2023-10-16 ENCOUNTER — Ambulatory Visit: Payer: MEDICAID | Admitting: Physician Assistant

## 2023-10-16 VITALS — BP 120/78 | Temp 97.9°F

## 2023-10-16 DIAGNOSIS — M50122 Cervical disc disorder at C5-C6 level with radiculopathy: Secondary | ICD-10-CM

## 2023-10-16 DIAGNOSIS — Z9889 Other specified postprocedural states: Secondary | ICD-10-CM

## 2023-10-16 DIAGNOSIS — M5010 Cervical disc disorder with radiculopathy, unspecified cervical region: Secondary | ICD-10-CM

## 2023-10-16 NOTE — Progress Notes (Signed)
   REFERRING PHYSICIAN:  Sandrea Hughs, Np [address] [place],  [place] [address]  DOS: 10/03/23, C5-6 arthroplasty  HISTORY OF PRESENT ILLNESS: Stephanie Holland is 2 weeks status post C5-6 arthroplasty. Overall, she is doing very well.  She states numbness and tingling in her arm has improved tremendously and she also feels as though her hands are stronger.  She is no longer taking any pain medication and she feels as though her pain is well-controlled.  She is very pleased with her surgery result at this time.  PHYSICAL EXAMINATION:  NEUROLOGICAL:  General: In no acute distress.   Awake, alert, oriented to person, place, and time.  Pupils equal round and reactive to light.  Facial tone is symmetric.    Strength: Side Biceps Triceps Deltoid Interossei Grip Wrist Ext. Wrist Flex.  R 5 5 5 5 5 5 5   L 5 5 5 5 5 5 5    Incision c/d/I.  Skin glue remains in place.  Imaging:  No new imaging in clinic today.  Assessment / Plan: Stephanie Holland is doing well 2 weeks status post C5-6 arthroplasty.  She is doing very well and is happy with her result.  She had immediate relief and the numbness and tingling in her arm.  We discussed activity escalation and I have advised the patient to lift up to 10 pounds until 6 weeks after surgery, then increase up to 25 pounds until 12 weeks after surgery.  After 12 weeks post-op, the patient advised to increase activity as tolerated. she will return to clinic in approximately 4 weeks.  She was encouraged to reach out for any questions or concerns she had in the meantime.    Advised to contact the office if any questions or concerns arise.   Joan Flores PA-C Dept of Neurosurgery

## 2023-10-24 ENCOUNTER — Other Ambulatory Visit: Payer: Self-pay

## 2023-10-28 ENCOUNTER — Other Ambulatory Visit: Payer: Self-pay

## 2023-10-29 ENCOUNTER — Other Ambulatory Visit: Payer: Self-pay

## 2023-11-01 ENCOUNTER — Other Ambulatory Visit: Payer: Self-pay

## 2023-11-03 ENCOUNTER — Other Ambulatory Visit: Payer: Self-pay

## 2023-11-05 ENCOUNTER — Other Ambulatory Visit: Payer: Self-pay

## 2023-11-06 ENCOUNTER — Other Ambulatory Visit: Payer: Self-pay

## 2023-11-06 MED ORDER — ARIPIPRAZOLE 5 MG PO TABS
5.0000 mg | ORAL_TABLET | Freq: Every day | ORAL | 3 refills | Status: DC
  Filled 2023-11-06 – 2023-11-19 (×2): qty 30, 30d supply, fill #0

## 2023-11-06 MED ORDER — SERTRALINE HCL 100 MG PO TABS
200.0000 mg | ORAL_TABLET | Freq: Every day | ORAL | 3 refills | Status: AC
  Filled 2023-11-06 – 2023-12-17 (×3): qty 60, 30d supply, fill #0
  Filled 2024-01-16: qty 60, 30d supply, fill #1
  Filled 2024-05-14: qty 60, 30d supply, fill #2
  Filled 2024-08-25: qty 60, 30d supply, fill #3

## 2023-11-06 MED ORDER — SERTRALINE HCL 100 MG PO TABS
200.0000 mg | ORAL_TABLET | Freq: Every day | ORAL | 0 refills | Status: DC
  Filled 2023-11-06: qty 60, 30d supply, fill #0

## 2023-11-06 MED ORDER — TRAZODONE HCL 100 MG PO TABS
150.0000 mg | ORAL_TABLET | Freq: Every evening | ORAL | 3 refills | Status: AC | PRN
  Filled 2023-11-06 – 2023-11-22 (×2): qty 45, 30d supply, fill #0
  Filled 2023-12-16 – 2023-12-17 (×2): qty 45, 30d supply, fill #1
  Filled 2024-01-16: qty 45, 30d supply, fill #2

## 2023-11-12 ENCOUNTER — Ambulatory Visit
Admission: RE | Admit: 2023-11-12 | Discharge: 2023-11-12 | Disposition: A | Discharge status: Home / Self Care | Payer: MEDICAID | Attending: Neurosurgery | Admitting: Neurosurgery

## 2023-11-12 ENCOUNTER — Ambulatory Visit
Admission: RE | Admit: 2023-11-12 | Discharge: 2023-11-12 | Disposition: A | Discharge status: Home / Self Care | Payer: MEDICAID | Source: Ambulatory Visit | Attending: Neurosurgery | Admitting: Neurosurgery

## 2023-11-12 ENCOUNTER — Other Ambulatory Visit: Payer: Self-pay

## 2023-11-12 DIAGNOSIS — M5010 Cervical disc disorder with radiculopathy, unspecified cervical region: Secondary | ICD-10-CM | POA: Insufficient documentation

## 2023-11-13 ENCOUNTER — Ambulatory Visit (INDEPENDENT_AMBULATORY_CARE_PROVIDER_SITE_OTHER): Payer: MEDICAID | Admitting: Neurosurgery

## 2023-11-13 VITALS — BP 126/90 | Temp 97.9°F | Wt 219.6 lb

## 2023-11-13 DIAGNOSIS — M5010 Cervical disc disorder with radiculopathy, unspecified cervical region: Secondary | ICD-10-CM

## 2023-11-13 DIAGNOSIS — M542 Cervicalgia: Secondary | ICD-10-CM

## 2023-11-13 DIAGNOSIS — M50122 Cervical disc disorder at C5-C6 level with radiculopathy: Secondary | ICD-10-CM

## 2023-11-13 DIAGNOSIS — Z09 Encounter for follow-up examination after completed treatment for conditions other than malignant neoplasm: Secondary | ICD-10-CM

## 2023-11-13 NOTE — Progress Notes (Signed)
 Patient checked in for appointment. She told Maryann Smalls that she was having chest pain and shortness of breath. We advised patient to go straight to the emergency room. Patient declined stating that she is having a panic attack. Kendelyn RN supervised the patient to make sure patient condition was not worsening. Patient was able to calm down. Kendelyn RN brought the patient back to the room. Patient endorses feeling better.

## 2023-11-13 NOTE — Progress Notes (Signed)
   REFERRING PHYSICIAN:  Ardia Kraft, Np [address] [place],  [place] [address]  DOS: 10/03/23, C5-6 arthroplasty  HISTORY OF PRESENT ILLNESS: Stephanie Holland is 6 weeks status post C5-6 arthroplasty.  Initially had a very good result and improved her numbness and tingling.  She has felt a slight recurrence of some of her symptoms.  She is also been having some difficulty managing her anxiety and depression and has been actively working on this.  She had to stop taking some of her medications due to these changes.  Overall her strength is stable.  She continues to be bothered by sciatica which has been a longstanding problem for her.  PHYSICAL EXAMINATION:  NEUROLOGICAL:  General: Tearful and anxious today in clinic Awake, alert, oriented to person, place, and time.  Pupils equal round and reactive to light.  Facial tone is symmetric.    Strength: Side Biceps Triceps Deltoid Interossei Grip Wrist Ext. Wrist Flex.  R 5 5 5 5 5 5 5   L 5 5 5 5 5 5 5    Incision c/d/I.  Healing well  Imaging:  Narrative & Impression  CLINICAL DATA:  Cervical disc disorder with radiculopathy. Stiffness in the neck.   EXAM: CERVICAL SPINE - 2-3 VIEW   COMPARISON:  None Available.   FINDINGS: Prior fixation at C5-6 level with intervertebral spacer without malalignment. There is no evidence of cervical spine fracture or prevertebral soft tissue swelling. Alignment is normal. Mild anterior spurring noted at C4, C5.   IMPRESSION: Prior fixation at C5-6 level with intervertebral spacer without malalignment.     Electronically Signed   By: Anna Barnes M.D.   On: 11/12/2023 15:23    Assessment / Plan: Stephanie Holland is doing well 2 weeks status post C5-6 arthroplasty.  She initially had a very strong result and was doing very well.  She has had some slight recurrence of some of her symptoms including tingling in her shoulders and hands as well as worsening with her sciatica.  She has also  had some difficulty with her anxiety and depression and has had to adjust some of her medications.  Overall her imaging looks good and her neurologic exam is stable.  We encouraged her to establish care back with her physical therapist as this can help with some of her postoperative issues.  She is also having some changes in her psychiatry medications, will send a message over to her psychiatrist in case they are considering Cymbalta as this often helps significantly with neuropathic pain as well.  Look forward to seeing her at her 12-week follow-up.  I explained to her that the course after decompression often goes in waves with improvements and slight step backs but hopefully over the course of a few months it will trend upwards.  Advised to contact the office if any questions or concerns arise.   Carroll Clamp, MD Dept of Neurosurgery

## 2023-11-13 NOTE — Patient Instructions (Signed)
 LOCAL PHYSICAL THERAPY  [facility]  [address]  [place], [place] [address]  [phone]  [facility]  [address] [place], [place] [address]  [phone]  [facility] (2 locations)  [address]  [place], [place] [address]  [phone]          or  [address]  [place], [place] [address]  [phone]  [facility]  [address]  [place], [place] [address]  [phone]  **dry needling**  [facility] [facility])  [address]  [place], [place] [address]  [phone]  Fax: [phone]  ** Aquatic therapy[address] [place], [place] [address] [phone] **Aquatic therapy**  [facility]  [facility]  [address]  [place], [place] [address]  [phone]  [facility]  [address]  [place], [place] [address]  [phone]  [facility]  [address]   Janora Norlander  [place], [place] [address]  [phone]  [facility]  [address]  [place], [place] [address]  [phone]  **dry needling**   PELVIC FLOOR/SI JOINT  [facility]  Mariane Masters, PT  [email or web address]   [place]  [facility]  [address]  [address] [place], [place] [address]  [phone]   [facility] [place]  [address], [place] [address]  [phone]   [facility], [place]  [facility]  Raymond Gurney, PT  [address]  [place], [place] [address] [phone]   Samara Deist  [facility]  [address]  [phone]   [facility]  [address]  [phone]   [facility]  Annamaria Boots [facility]  [address]  [place], [place] [address]  [phone]   **No Aetna or medicaid**  [facility]  [address] [place] [address]  [phone]  [facility]  [address]  [place], [place] [address]  [phone]  Dannial Monarch, DC  [address] [place], [place] [address]  [phone]  [facility]  [address] [place] [address]  [phone]  [facility] D[facility]  [address]  [place], [place] [address]  [phone]  [facility]  (3 locations)  [address]  [place], [place] [address]  [phone]  **dry needling**           or  [address] [place], [place] [address]  [phone]  **Additionally has Gloris Manchester, OT**           or  [address]  [place], [place] [address]  [phone]  **Pediatric therapy**  [facility]  [address]  [phone]  **dry needlingVerdie Drown [facility]  [address]  [place], [place] [address]  [phone]  [facility]   (Inside [address] [facility])  [address]  [place], [place] [address]  [phone]  **dry needling**  **MEDICAID or UNINSURED** [facility] dept. Of Physical Therapy [place], [place] [address] [phone]  [facility]  [address] [place], [place] [address]  [phone]   [facility]  [address]  [place], [place] [address]  [phone]   Doreatha Martin  [facility]  [address] [place], [place] [address]  [phone]   [facility]  [address]  [place], [place] [address]  [phone]   [facility]  [address] [place], [place] [address]  [phone]  [facility]  [address]  [place], [place] [address]  [phone]  AQUATIC  Kathalene Frames [facility]  [employer]  [facility]  [place]  [place]  *Residents only*   [facility] [employer]  *[facility]  Exercise class  [facility]  Exercise class  [facility]  [address]  [place], [place]   [phone]  [facility]  [address]  [place], [place] [address]  [phone]   [place], [place]  [facility]  [address]  [phone]   [facility]  [facility]  [address]  [phone]           or  [address]  [phone]   [facility] Arthritis Support Group   Provides education and support and practical information for coping with arthritis for arthritis sufferers and their families.   When: 12:15 - 1:30 p.m. the second Monday of each month, March through December  Info: Call Rehabilitation Services at [phone]

## 2023-11-14 ENCOUNTER — Encounter: Payer: Self-pay | Admitting: Physical Therapy

## 2023-11-14 ENCOUNTER — Ambulatory Visit: Payer: MEDICAID | Attending: Neurosurgery | Admitting: Physical Therapy

## 2023-11-14 DIAGNOSIS — M542 Cervicalgia: Secondary | ICD-10-CM | POA: Insufficient documentation

## 2023-11-14 DIAGNOSIS — M5010 Cervical disc disorder with radiculopathy, unspecified cervical region: Secondary | ICD-10-CM | POA: Diagnosis not present

## 2023-11-14 DIAGNOSIS — R2689 Other abnormalities of gait and mobility: Secondary | ICD-10-CM | POA: Insufficient documentation

## 2023-11-14 DIAGNOSIS — M6281 Muscle weakness (generalized): Secondary | ICD-10-CM | POA: Diagnosis present

## 2023-11-14 DIAGNOSIS — R269 Unspecified abnormalities of gait and mobility: Secondary | ICD-10-CM | POA: Diagnosis present

## 2023-11-14 NOTE — Therapy (Signed)
OUTPATIENT PHYSICAL THERAPY CERVICAL EVAL   Patient Name: Stephanie Holland MRN: [id number] DOB:July 28, 1966, 58 y.o., female Today's Date: 11/14/2023  END OF SESSION:  PT End of Session - 11/14/23 1406     Visit Number 1    Number of Visits 16    Date for PT Re-Evaluation 01/09/24    Progress Note Due on Visit 10    PT Start Time 1403    PT Stop Time 1444    PT Time Calculation (min) 41 min             Past Medical History:  Diagnosis Date   Alcohol abuse    sober since 06-2022   Anxiety    Bipolar 1 disorder, depressed (HCC)    C6 radiculopathy    Cervical spondylosis    Chronic low back pain with bilateral sciatica    Cocaine abuse in remission (HCC)    clean since 06-2022   Depression    Dyspnea    Family history of adverse reaction to anesthesia    mom-delayed emergence   GERD (gastroesophageal reflux disease)    rare-no meds   IBS (irritable bowel syndrome)    Insomnia    Manic depression (HCC)    Migraine    Motion sickness    cars   Obesity    Past Surgical History:  Procedure Laterality Date   APPENDECTOMY     age 67   CARPAL TUNNEL RELEASE Right 04/23/2023   Procedure: CARPAL TUNNEL RELEASE;  Surgeon: Kennedy Bucker, MD;  Location: [facility];  Service: Orthopedics;  Laterality: Right;   CERVICAL DISC ARTHROPLASTY N/A 10/03/2023   Procedure: C5-6 ARTHROPLASTY;  Surgeon: Lovenia Kim, MD;  Location: [facility];  Service: Neurosurgery;  Laterality: N/A;   COLONOSCOPY WITH PROPOFOL N/A 03/15/2023   Procedure: COLONOSCOPY WITH PROPOFOL;  Surgeon: Midge Minium, MD;  Location: [facility];  Service: Endoscopy;  Laterality: N/A;   Patient Active Problem List   Diagnosis Date Noted   Cervical radiculopathy 10/03/2023   Cervical disc disorder with radiculopathy 09/24/2023   Cervical disc disorder with radiculopathy of cervical region 08/30/2023   Cervicalgia 08/12/2023   Chronic bilateral low back pain with bilateral sciatica 08/12/2023    Spondylosis, cervical, with myelopathy 07/10/2023   Diarrhea 03/15/2023    PCP: Sandrea Hughs, NP   REFERRING PROVIDER: Lovenia Kim, MD   REFERRING DIAG:  M54.2 (ICD-10-CM) - Cervicalgia  M50.10 (ICD-10-CM) - Cervical disc disorder with radiculopathy of cervical region    THERAPY DIAG:  Muscle weakness (generalized)  Neck pain  Cervicalgia  Rationale for Evaluation and Treatment: Rehabilitation  ONSET DATE: 10/15/24  SUBJECTIVE:  SUBJECTIVE STATEMENT: Pt reports initially her surgery cured her pain but the pain and tingling returned and is in both hands. Pt not been sleeping well. Her granddaughter has been going through a lot of issues. Pt is having stiffness in her neck, report she has been scared to move it.   Note: Portions of this document were prepared using Dragon voice recognition software and although reviewed may contain unintentional dictation errors in syntax, grammar, or spelling.   Hand dominance: Right  PERTINENT HISTORY:  Patient had C5/C5-6 arthroplasty on 10/03/2023.  Was recovering well but then started to have some similar discomfort in her hands and upper extremities and was referred to physical therapy to work on cervical mobility and to improve pain.  PAIN:  Are you having pain? Yes: NPRS scale: 7 Pain location: R shoulder  Pain description: sharp Aggravating factors: use of shoulder  Relieving factors: n/a  PRECAUTIONS: Cervical  RED FLAGS: None     WEIGHT BEARING RESTRICTIONS: No  FALLS:  Has patient fallen in last 6 months? No  LIVING ENVIRONMENT: Lives with: lives in a boarding home Lives in: House/apartment Stairs: Yes: External: 15 steps; on right going up Has following equipment at home: None  OCCUPATION: Not working but  would like to get into vocational rehabilitation. She wants to get into a job she can help other people  PLOF: Independent  PATIENT GOALS: Improve pain and hopefully get back to working   NEXT MD VISIT: saw MD yesterday   OBJECTIVE:  Note: Objective measures were completed at Evaluation unless otherwise noted.  DIAGNOSTIC FINDINGS:  IMPRESSION 11/14/23: Prior fixation at C5-6 level with intervertebral spacer without malalignment.  PATIENT SURVEYS:  FOTO 51  COGNITION: Overall cognitive status: Within functional limits for tasks assessed   POSTURE: rounded shoulders and forward head  PALPATION: Very Tender to palpation in R UT and R pec minor    CERVICAL ROM:   Active ROM A/PROM (deg) eval  Flexion WNL  Extension 22  Right lateral flexion 20  Left lateral flexion 20  Right rotation 55  Left rotation 50   (Blank rows = not tested)  UPPER EXTREMITY ROM:  Active ROM Right eval Left eval  Shoulder flexion Pain but WNL   Shoulder extension    Shoulder abduction Pain but WNL   Shoulder adduction    Shoulder extension    Shoulder internal rotation    Shoulder external rotation    Elbow flexion    Elbow extension    Wrist flexion    Wrist extension    Wrist ulnar deviation    Wrist radial deviation    Wrist pronation    Wrist supination     (Blank rows = not tested)  UPPER EXTREMITY MMT:  MMT Right eval Left eval  Shoulder flexion    Shoulder extension    Shoulder abduction    Shoulder adduction    Shoulder extension    Shoulder internal rotation    Shoulder external rotation    Middle trapezius    Lower trapezius    Elbow flexion    Elbow extension    Wrist flexion    Wrist extension    Wrist ulnar deviation    Wrist radial deviation    Wrist pronation    Wrist supination    Grip strength     (Blank rows = not tested)    TREATMENT DATE: 11/14/23  PATIENT EDUCATION:  Education details: POC Person educated: Patient Education method: Explanation Education comprehension: verbalized understanding   HOME EXERCISE PROGRAM: Access Code: [email or web address] URL: [email or web address] Date: 11/14/2023 Prepared by: Thresa Ross  Exercises - Seated Head Rotation  - 2 x daily - 7 x weekly - 1 sets - 10 reps - 3 sec hold - Seated Cervical Sidebending AROM  - 3 x daily - 7 x weekly - 1 sets - 10 reps - 5 sec hold - Supine Chin Tuck  - 1 x daily - 7 x weekly - 1 sets - 10 reps -  sec hold - Seated Scapular Retraction  - 3 x daily - 7 x weekly - 1 sets - 10 reps   ASSESSMENT:  CLINICAL IMPRESSION: Patient is a 58 year old female who presents to physical therapy following C5-6 arthroplasty on 10/03/2023.  Patient initially experienced improvement in pain both in her neck and in her symptoms in her upper extremities but the symptoms in her upper extremities have gradually returned and patient is also developed some stiffness in her neck as a result of immobility as well as fear of movement causing pain.  Patient has deficits in cervical mobility as well as shoulder mobility through painless range of motion.  Patient will benefit from skilled physical therapy to improve her postural strength, cervical mobility, and pain.  OBJECTIVE IMPAIRMENTS: decreased activity tolerance, decreased ROM, impaired UE functional use, postural dysfunction, and pain.   ACTIVITY LIMITATIONS: carrying, dressing, and reach over head  PARTICIPATION LIMITATIONS: cleaning, community activity, and occupation  PERSONAL FACTORS: Age, Time since onset of injury/illness/exacerbation, and 3+ comorbidities: Anxiety, Bipolar 1 disorder, depressed, Chronic LBP  are also affecting patient's functional outcome.   REHAB POTENTIAL: Fair secondary to chronicity of pain and influence of stress and anxiety on her levels of pain and stressors  in her life influencing this.   CLINICAL DECISION MAKING: Evolving/moderate complexity  EVALUATION COMPLEXITY: Moderate   GOALS: Goals reviewed with patient? Yes  SHORT TERM GOALS: Target date: 12/12/2023      Patient will be independent in home exercise program to improve strength/mobility for better functional independence with ADLs. Baseline: No HEP currently  Goal status: INITIAL   LONG TERM GOALS: Target date: 01/09/2024   Patient will improve focus on therapeutic outcome survey score by 6 points or greater in order to indicate improved subjective level of functioning based on her recent surgical procedure Baseline: 51 Goal status: INITIAL  2.  Patient will decrease [place] scale 11 by 10 points or greater in order to indicate improved subjective level of confidence in her ability to complete daily activities and general exercise without increase in pain. Baseline: 28 Goal status: INITIAL  3.  Patient will improve cervical range of motion with sidebending by 15 degrees bilaterally in order to indicate improved cervical active range of motion and stiffness Baseline: See eval Goal status: INITIAL   PLAN:  PT FREQUENCY: 1-2x/week  PT DURATION: 8 weeks  PLANNED INTERVENTIONS: 97110-Therapeutic exercises, 97530- Therapeutic activity, 97112- Neuromuscular re-education, 97535- Self Care, 54098- Manual therapy, [id number]- Electrical stimulation (manual), 97035- Ultrasound, Balance training, Dry Needling, Joint mobilization, Spinal manipulation, and Moist heat  PLAN FOR NEXT SESSION: Cervical ROM, postural strength, assess pain in R shoulder and treat if pain is still in this area    Norman Herrlich, PT 11/14/2023, 2:07 PM

## 2023-11-19 ENCOUNTER — Other Ambulatory Visit: Payer: Self-pay

## 2023-11-22 ENCOUNTER — Other Ambulatory Visit: Payer: Self-pay

## 2023-11-25 ENCOUNTER — Ambulatory Visit: Payer: MEDICAID | Admitting: Physical Therapy

## 2023-11-25 ENCOUNTER — Other Ambulatory Visit: Payer: Self-pay

## 2023-11-25 DIAGNOSIS — M6281 Muscle weakness (generalized): Secondary | ICD-10-CM

## 2023-11-25 DIAGNOSIS — M542 Cervicalgia: Secondary | ICD-10-CM

## 2023-11-25 NOTE — Therapy (Signed)
OUTPATIENT PHYSICAL THERAPY CERVICAL TREATMENT   Patient Name: Stephanie Holland MRN: [id number] DOB:1966/02/07, 58 y.o., female Today's Date: 11/25/2023  END OF SESSION:  PT End of Session - 11/25/23 1440     Visit Number 2    Number of Visits 16    Date for PT Re-Evaluation 01/09/24    Progress Note Due on Visit 10    PT Start Time 1446    PT Stop Time 1527    PT Time Calculation (min) 41 min              Past Medical History:  Diagnosis Date   Alcohol abuse    sober since 06-2022   Anxiety    Bipolar 1 disorder, depressed (HCC)    C6 radiculopathy    Cervical spondylosis    Chronic low back pain with bilateral sciatica    Cocaine abuse in remission (HCC)    clean since 06-2022   Depression    Dyspnea    Family history of adverse reaction to anesthesia    mom-delayed emergence   GERD (gastroesophageal reflux disease)    rare-no meds   IBS (irritable bowel syndrome)    Insomnia    Manic depression (HCC)    Migraine    Motion sickness    cars   Obesity    Past Surgical History:  Procedure Laterality Date   APPENDECTOMY     age 55   CARPAL TUNNEL RELEASE Right 04/23/2023   Procedure: CARPAL TUNNEL RELEASE;  Surgeon: Kennedy Bucker, MD;  Location: [facility];  Service: Orthopedics;  Laterality: Right;   CERVICAL DISC ARTHROPLASTY N/A 10/03/2023   Procedure: C5-6 ARTHROPLASTY;  Surgeon: Lovenia Kim, MD;  Location: [facility];  Service: Neurosurgery;  Laterality: N/A;   COLONOSCOPY WITH PROPOFOL N/A 03/15/2023   Procedure: COLONOSCOPY WITH PROPOFOL;  Surgeon: Midge Minium, MD;  Location: [facility];  Service: Endoscopy;  Laterality: N/A;   Patient Active Problem List   Diagnosis Date Noted   Cervical radiculopathy 10/03/2023   Cervical disc disorder with radiculopathy 09/24/2023   Cervical disc disorder with radiculopathy of cervical region 08/30/2023   Cervicalgia 08/12/2023   Chronic bilateral low back pain with bilateral sciatica  08/12/2023   Spondylosis, cervical, with myelopathy 07/10/2023   Diarrhea 03/15/2023    PCP: Sandrea Hughs, NP   REFERRING PROVIDER: Lovenia Kim, MD   REFERRING DIAG:  M54.2 (ICD-10-CM) - Cervicalgia  M50.10 (ICD-10-CM) - Cervical disc disorder with radiculopathy of cervical region    THERAPY DIAG:  Neck pain  Cervicalgia  Muscle weakness (generalized)  Rationale for Evaluation and Treatment: Rehabilitation  ONSET DATE: 10/15/24  SUBJECTIVE:  SUBJECTIVE STATEMENT: Pt reports having a meeting with her house mates and is having a headache.   Note: Portions of this document were prepared using Dragon voice recognition software and although reviewed may contain unintentional dictation errors in syntax, grammar, or spelling.   Hand dominance: Right  PERTINENT HISTORY:  Patient had C5/C5-6 arthroplasty on 10/03/2023.  Was recovering well but then started to have some similar discomfort in her hands and upper extremities and was referred to physical therapy to work on cervical mobility and to improve pain.  PAIN:  Are you having pain? Yes: NPRS scale: 7 Pain location: R shoulder and "headache type pain"   Pain description: sharp Aggravating factors: use of shoulder  Relieving factors: n/a  PRECAUTIONS: Cervical  RED FLAGS: None     WEIGHT BEARING RESTRICTIONS: No  FALLS:  Has patient fallen in last 6 months? No  LIVING ENVIRONMENT: Lives with: lives in a boarding home Lives in: House/apartment Stairs: Yes: External: 15 steps; on right going up Has following equipment at home: None  OCCUPATION: Not working but would like to get into vocational rehabilitation. She wants to get into a job she can help other people  PLOF: Independent  PATIENT GOALS: Improve  pain and hopefully get back to working   NEXT MD VISIT: saw MD yesterday   OBJECTIVE:  Note: Objective measures were completed at Evaluation unless otherwise noted.  DIAGNOSTIC FINDINGS:  IMPRESSION 11/14/23: Prior fixation at C5-6 level with intervertebral spacer without malalignment.  PATIENT SURVEYS:  FOTO 51  COGNITION: Overall cognitive status: Within functional limits for tasks assessed   POSTURE: rounded shoulders and forward head  PALPATION: Very Tender to palpation in R UT and R pec minor    CERVICAL ROM:   Active ROM A/PROM (deg) eval  Flexion WNL  Extension 22  Right lateral flexion 20  Left lateral flexion 20  Right rotation 55  Left rotation 50   (Blank rows = not tested)  UPPER EXTREMITY ROM:  Active ROM Right eval Left eval  Shoulder flexion Pain but WNL   Shoulder extension    Shoulder abduction Pain but WNL   Shoulder adduction    Shoulder extension    Shoulder internal rotation    Shoulder external rotation    Elbow flexion    Elbow extension    Wrist flexion    Wrist extension    Wrist ulnar deviation    Wrist radial deviation    Wrist pronation    Wrist supination     (Blank rows = not tested)  UPPER EXTREMITY MMT:  MMT Right eval Left eval  Shoulder flexion    Shoulder extension    Shoulder abduction    Shoulder adduction    Shoulder extension    Shoulder internal rotation    Shoulder external rotation    Middle trapezius    Lower trapezius    Elbow flexion    Elbow extension    Wrist flexion    Wrist extension    Wrist ulnar deviation    Wrist radial deviation    Wrist pronation    Wrist supination    Grip strength     (Blank rows = not tested)    TREATMENT DATE: 11/25/23  Manual: 3 rounds of suboccipital release and cervical distraction x 60 seconds each.  Patient reports improvement in  headache symptoms with this activity. Tempted stretching of bilateral upper trapezius muscles with this caused increased numbness on bilateral lower extremities and discomfort down the arm.  Ceased activity as a result Ischemic trigger point release to suboccipital muscles x 4 minutes Grade 2 cervical passive intervertebral movement x 3 to 4 minutes  Therapeutic exercise 2 sets of 10 repetitions of chin tucks with 2-second hold Tender rotations of shoulder flexion in supine with 3-second hold at end range for stretch Median nerve glide x 30 repetitions in supine with manual assistance for proper form and technique   PATIENT EDUCATION:  Education details: POC Person educated: Patient Education method: Explanation Education comprehension: verbalized understanding   HOME EXERCISE PROGRAM: Access Code: [email or web address] URL: [email or web address] Date: 11/14/2023 Prepared by: Thresa Ross  Exercises - Seated Head Rotation  - 2 x daily - 7 x weekly - 1 sets - 10 reps - 3 sec hold - Seated Cervical Sidebending AROM  - 3 x daily - 7 x weekly - 1 sets - 10 reps - 5 sec hold - Supine Chin Tuck  - 1 x daily - 7 x weekly - 1 sets - 10 reps -  sec hold - Seated Scapular Retraction  - 3 x daily - 7 x weekly - 1 sets - 10 reps   ASSESSMENT:  CLINICAL IMPRESSION: Patient presents with good motivation for physical therapy activities.  Patient still focused on her level of pain and requires cues for proper performance of exercises as well as cues to prevent focus on pain during session.  Patient responded well to interventions particularly the manual based interventions.  Ducted patient to sleep in supine rather than in prone to prevent excessive stretch and discomfort in the neck.  Patient will report back next time regarding how this went.  Physical therapist instructed her even if she starts this position and moves throughout the night it was to be better than her current sleeping  arrangements.Pt will continue to benefit from skilled physical therapy intervention to address impairments, improve QOL, and attain therapy goals.    OBJECTIVE IMPAIRMENTS: decreased activity tolerance, decreased ROM, impaired UE functional use, postural dysfunction, and pain.   ACTIVITY LIMITATIONS: carrying, dressing, and reach over head  PARTICIPATION LIMITATIONS: cleaning, community activity, and occupation  PERSONAL FACTORS: Age, Time since onset of injury/illness/exacerbation, and 3+ comorbidities: Anxiety, Bipolar 1 disorder, depressed, Chronic LBP  are also affecting patient's functional outcome.   REHAB POTENTIAL: Fair secondary to chronicity of pain and influence of stress and anxiety on her levels of pain and stressors in her life influencing this.   CLINICAL DECISION MAKING: Evolving/moderate complexity  EVALUATION COMPLEXITY: Moderate   GOALS: Goals reviewed with patient? Yes  SHORT TERM GOALS: Target date: 12/12/2023      Patient will be independent in home exercise program to improve strength/mobility for better functional independence with ADLs. Baseline: No HEP currently  Goal status: INITIAL   LONG TERM GOALS: Target date: 01/09/2024   Patient will improve focus on therapeutic outcome survey score by 6 points or greater in order to indicate improved subjective level of functioning based on her recent surgical procedure Baseline: 51 Goal status: INITIAL  2.  Patient will decrease [place] scale 11 by 10 points or greater in order to indicate improved subjective level of confidence in her ability to complete daily activities and general exercise without increase in pain. Baseline:  28 Goal status: INITIAL  3.  Patient will improve cervical range of motion with sidebending by 15 degrees bilaterally in order to indicate improved cervical active range of motion and stiffness Baseline: See eval Goal status: INITIAL   PLAN:  PT FREQUENCY: 1-2x/week  PT  DURATION: 8 weeks  PLANNED INTERVENTIONS: 97110-Therapeutic exercises, 97530- Therapeutic activity, 97112- Neuromuscular re-education, 97535- Self Care, 16109- Manual therapy, [id number]- Electrical stimulation (manual), 97035- Ultrasound, Balance training, Dry Needling, Joint mobilization, Spinal manipulation, and Moist heat  PLAN FOR NEXT SESSION: Cervical ROM as tolerated, postural strength, median nerve glides, suboccipital release to glenohumeral distraction.  Anything to increase space for the cervical foramen   Norman Herrlich, PT 11/25/2023, 2:51 PM

## 2023-11-27 ENCOUNTER — Ambulatory Visit: Payer: MEDICAID

## 2023-11-27 DIAGNOSIS — R2689 Other abnormalities of gait and mobility: Secondary | ICD-10-CM

## 2023-11-27 DIAGNOSIS — M542 Cervicalgia: Secondary | ICD-10-CM

## 2023-11-27 DIAGNOSIS — R269 Unspecified abnormalities of gait and mobility: Secondary | ICD-10-CM

## 2023-11-27 DIAGNOSIS — M6281 Muscle weakness (generalized): Secondary | ICD-10-CM

## 2023-11-27 NOTE — Therapy (Signed)
OUTPATIENT PHYSICAL THERAPY TREATMENT   Patient Name: Stephanie Holland MRN: [id number] DOB:03/22/1966, 58 y.o., female Today's Date: 11/27/2023  END OF SESSION:  PT End of Session - 11/27/23 1048     Visit Number 3    Number of Visits 16    Date for PT Re-Evaluation 01/09/24    Authorization Type [facility] Plan    Progress Note Due on Visit 10    PT Start Time 1045    PT Stop Time 1125   arrived early, started early;   PT Time Calculation (min) 40 min    Activity Tolerance Patient tolerated treatment well;No increased pain;Patient limited by fatigue    Behavior During Therapy [facility] for tasks assessed/performed              Past Medical History:  Diagnosis Date   Alcohol abuse    sober since 06-2022   Anxiety    Bipolar 1 disorder, depressed (HCC)    C6 radiculopathy    Cervical spondylosis    Chronic low back pain with bilateral sciatica    Cocaine abuse in remission (HCC)    clean since 06-2022   Depression    Dyspnea    Family history of adverse reaction to anesthesia    mom-delayed emergence   GERD (gastroesophageal reflux disease)    rare-no meds   IBS (irritable bowel syndrome)    Insomnia    Manic depression (HCC)    Migraine    Motion sickness    cars   Obesity    Past Surgical History:  Procedure Laterality Date   APPENDECTOMY     age 11   CARPAL TUNNEL RELEASE Right 04/23/2023   Procedure: CARPAL TUNNEL RELEASE;  Surgeon: Kennedy Bucker, MD;  Location: [facility];  Service: Orthopedics;  Laterality: Right;   CERVICAL DISC ARTHROPLASTY N/A 10/03/2023   Procedure: C5-6 ARTHROPLASTY;  Surgeon: Lovenia Kim, MD;  Location: [facility];  Service: Neurosurgery;  Laterality: N/A;   COLONOSCOPY WITH PROPOFOL N/A 03/15/2023   Procedure: COLONOSCOPY WITH PROPOFOL;  Surgeon: Midge Minium, MD;  Location: [facility];  Service: Endoscopy;  Laterality: N/A;   Patient Active Problem List   Diagnosis Date Noted   Cervical radiculopathy  10/03/2023   Cervical disc disorder with radiculopathy 09/24/2023   Cervical disc disorder with radiculopathy of cervical region 08/30/2023   Cervicalgia 08/12/2023   Chronic bilateral low back pain with bilateral sciatica 08/12/2023   Spondylosis, cervical, with myelopathy 07/10/2023   Diarrhea 03/15/2023    PCP: Sandrea Hughs, NP   REFERRING PROVIDER: Lovenia Kim, MD   REFERRING DIAG:  M54.2 (ICD-10-CM) - Cervicalgia  M50.10 (ICD-10-CM) - Cervical disc disorder with radiculopathy of cervical region    THERAPY DIAG:  Neck pain  Cervicalgia  Muscle weakness (generalized)  Other abnormalities of gait and mobility  Abnormality of gait and mobility  Rationale for Evaluation and Treatment: Rehabilitation  ONSET DATE: 10/15/24  SUBJECTIVE:  SUBJECTIVE STATEMENT: Pt denies any significant updates since last session. Reports good temp relief in symptoms post session, but symptoms return within  an hour or few.   Note: Portions of this document were prepared using Dragon voice recognition software and although reviewed may contain unintentional dictation errors in syntax, grammar, or spelling.   Hand dominance: Right  PERTINENT HISTORY:  Patient had C5/C5-6 arthroplasty on 10/03/2023.  Was recovering well but then started to have some similar discomfort in her hands and upper extremities and was referred to physical therapy to work on cervical mobility and to improve pain.  PAIN:  Are you having pain? 5/10  RUE achiness shoulder to hand; same on LUE but not as severe.   PRECAUTIONS: Cervical  RED FLAGS: None      WEIGHT BEARING RESTRICTIONS: No  FALLS:  Has patient fallen in last 6 months? No  LIVING ENVIRONMENT: Lives with: lives in a boarding home Lives in:  House/apartment Stairs: Yes: External: 15 steps; on right going up Has following equipment at home: None  OCCUPATION: Not working but would like to get into vocational rehabilitation. She wants to get into a job she can help other people  PLOF: Independent  PATIENT GOALS: Improve pain and hopefully get back to working   NEXT MD VISIT: saw MD yesterday   OBJECTIVE:  Note: Objective measures were completed at Evaluation unless otherwise noted.  DIAGNOSTIC FINDINGS:  IMPRESSION 11/14/23: Prior fixation at C5-6 level with intervertebral spacer without malalignment.  PATIENT SURVEYS:  FOTO 51  COGNITION: Overall cognitive status: Within functional limits for tasks assessed   POSTURE: rounded shoulders and forward head  PALPATION: Very Tender to palpation in R UT and R pec minor    CERVICAL ROM:   Active ROM A/PROM (deg) eval  Flexion WNL  Extension 22  Right lateral flexion 20  Left lateral flexion 20  Right rotation 55  Left rotation 50   (Blank rows = not tested)  UPPER EXTREMITY ROM:  Active ROM Right eval Left eval  Shoulder flexion Pain but WNL   Shoulder extension    Shoulder abduction Pain but WNL   Shoulder adduction    Shoulder extension    Shoulder internal rotation    Shoulder external rotation    Elbow flexion    Elbow extension    Wrist flexion    Wrist extension    Wrist ulnar deviation    Wrist radial deviation    Wrist pronation    Wrist supination     (Blank rows = not tested)  UPPER EXTREMITY MMT:  MMT Right eval Left eval  Shoulder flexion    Shoulder extension    Shoulder abduction    Shoulder adduction    Shoulder extension    Shoulder internal rotation    Shoulder external rotation    Middle trapezius    Lower trapezius    Elbow flexion    Elbow extension    Wrist flexion    Wrist extension    Wrist ulnar deviation    Wrist radial deviation    Wrist pronation    Wrist supination    Grip strength     (Blank rows =  not tested)   TREATMENT DATE: 11/27/23                                                                                                                               -  heat applied to posterior neck in hooklying, legs supported -bilat median/radial nerve glide x20 (elbow at 90 degrees)  -bilat elbow ROM 0-end range x15 bilat -pec minor release bilat (no significant spasm, but makes RUE paresthesia and radiating pain worse)  -isometric bilat row c scapula retraction 10x3secH  -isometric cervical retraction into pillow 10x3secH  -Hooklying cervical rotation A/ROM 1x15 bilat, pain free range  -cervical and scapular muslce release: most tender and tight Right anterior deltoid/coracobrachialis -4 limb BP assessment. All =    Manual: 3 rounds of suboccipital release and cervical distraction x 60 seconds each.  Patient reports improvement in headache symptoms with this activity. Tempted stretching of bilateral upper trapezius muscles with this caused increased numbness on bilateral lower extremities and discomfort down the arm.  Ceased activity as a result Ischemic trigger point release to suboccipital muscles x 4 minutes Grade 2 cervical passive intervertebral movement x 3 to 4 minutes  Therapeutic exercise 2 sets of 10 repetitions of chin tucks with 2-second hold Tender rotations of shoulder flexion in supine with 3-second hold at end range for stretch Median nerve glide x 30 repetitions in supine with manual assistance for proper form and technique   PATIENT EDUCATION:  Education details: POC Person educated: Patient Education method: Explanation Education comprehension: verbalized understanding   HOME EXERCISE PROGRAM: Access Code: [email or web address] URL: [email or web address] Date: 11/14/2023 Prepared by: Thresa Ross  Exercises - Seated Head Rotation  - 2 x daily - 7 x weekly - 1 sets - 10 reps - 3 sec hold - Seated Cervical Sidebending AROM  - 3 x daily - 7 x weekly -  1 sets - 10 reps - 5 sec hold - Supine Chin Tuck  - 1 x daily - 7 x weekly - 1 sets - 10 reps -  sec hold - Seated Scapular Retraction  - 3 x daily - 7 x weekly - 1 sets - 10 reps   ASSESSMENT:  CLINICAL IMPRESSION: Continued to work on pain management, genlte ROM, and motor control training. Addressed spastic muscles that were tolerant of manual release. Intermitent paresthesias in hands during session. Pt reports this to be a presurgical phenomenon. Pt will continue to benefit from skilled physical therapy intervention to address impairments, improve QOL, and attain therapy goals.    OBJECTIVE IMPAIRMENTS: decreased activity tolerance, decreased ROM, impaired UE functional use, postural dysfunction, and pain.   ACTIVITY LIMITATIONS: carrying, dressing, and reach over head  PARTICIPATION LIMITATIONS: cleaning, community activity, and occupation  PERSONAL FACTORS: Age, Time since onset of injury/illness/exacerbation, and 3+ comorbidities: Anxiety, Bipolar 1 disorder, depressed, Chronic LBP  are also affecting patient's functional outcome.   REHAB POTENTIAL: Fair secondary to chronicity of pain and influence of stress and anxiety on her levels of pain and stressors in her life influencing this.   CLINICAL DECISION MAKING: Evolving/moderate complexity  EVALUATION COMPLEXITY: Moderate   GOALS: Goals reviewed with patient? Yes  SHORT TERM GOALS: Target date: 12/12/2023    Patient will be independent in home exercise program to improve strength/mobility for better functional independence with ADLs. Baseline: No HEP currently  Goal status: INITIAL   LONG TERM GOALS: Target date: 01/09/2024   Patient will improve focus on therapeutic outcome survey score by 6 points or greater in order to indicate improved subjective level of functioning based on her recent surgical procedure Baseline: 51 Goal status: INITIAL  2.  Patient will decrease [place] scale 11 by 10 points or greater in  order to indicate improved subjective level  of confidence in her ability to complete daily activities and general exercise without increase in pain. Baseline: 28 Goal status: INITIAL  3.  Patient will improve cervical range of motion with sidebending by 15 degrees bilaterally in order to indicate improved cervical active range of motion and stiffness Baseline: See eval Goal status: INITIAL   PLAN:  PT FREQUENCY: 1-2x/week  PT DURATION: 8 weeks  PLANNED INTERVENTIONS: 97110-Therapeutic exercises, 97530- Therapeutic activity, 97112- Neuromuscular re-education, 97535- Self Care, 16109- Manual therapy, [phone]- Electrical stimulation (manual), 97035- Ultrasound, Balance training, Dry Needling, Joint mobilization, Spinal manipulation, and Moist heat  PLAN FOR NEXT SESSION: Cervical ROM as tolerated, postural strength, median nerve glides, suboccipital release to glenohumeral distraction, monitor address any tight muscle areas   Prayan Ulin C, PT 11/27/2023, 11:00 AM  11:00 AM, 11/27/23 Rosamaria Lints, PT, DPT Physical Therapist - [facility]  Outpatient Physical Therapy- Main Campus [phone]

## 2023-12-04 ENCOUNTER — Ambulatory Visit: Payer: MEDICAID | Admitting: Physical Therapy

## 2023-12-06 ENCOUNTER — Ambulatory Visit: Payer: MEDICAID | Admitting: Physical Therapy

## 2023-12-09 ENCOUNTER — Ambulatory Visit: Payer: MEDICAID | Admitting: Physical Therapy

## 2023-12-11 ENCOUNTER — Ambulatory Visit: Payer: MEDICAID | Admitting: Physical Therapy

## 2023-12-16 ENCOUNTER — Other Ambulatory Visit: Payer: Self-pay

## 2023-12-17 ENCOUNTER — Ambulatory Visit: Payer: MEDICAID | Admitting: Physical Therapy

## 2023-12-17 ENCOUNTER — Other Ambulatory Visit: Payer: Self-pay

## 2023-12-20 ENCOUNTER — Ambulatory Visit: Payer: MEDICAID | Attending: Neurosurgery | Admitting: Physical Therapy

## 2023-12-20 DIAGNOSIS — R2689 Other abnormalities of gait and mobility: Secondary | ICD-10-CM | POA: Diagnosis present

## 2023-12-20 DIAGNOSIS — R269 Unspecified abnormalities of gait and mobility: Secondary | ICD-10-CM | POA: Insufficient documentation

## 2023-12-20 DIAGNOSIS — M542 Cervicalgia: Secondary | ICD-10-CM | POA: Diagnosis present

## 2023-12-20 DIAGNOSIS — M6281 Muscle weakness (generalized): Secondary | ICD-10-CM

## 2023-12-20 NOTE — Therapy (Signed)
 OUTPATIENT PHYSICAL THERAPY TREATMENT   Patient Name: Stephanie Holland MRN: [id number] DOB:06-18-1966, 58 y.o., female Today's Date: 12/20/2023  END OF SESSION:  PT End of Session - 12/20/23 0819     Visit Number 4    Number of Visits 16    Date for PT Re-Evaluation 01/09/24    Authorization Type [facility] Plan    Progress Note Due on Visit 10    PT Start Time 0845    PT Stop Time 0925    PT Time Calculation (min) 40 min    Activity Tolerance Patient tolerated treatment well;No increased pain;Patient limited by fatigue    Behavior During Therapy [facility] for tasks assessed/performed              Past Medical History:  Diagnosis Date   Alcohol abuse    sober since 06-2022   Anxiety    Bipolar 1 disorder, depressed (HCC)    C6 radiculopathy    Cervical spondylosis    Chronic low back pain with bilateral sciatica    Cocaine abuse in remission (HCC)    clean since 06-2022   Depression    Dyspnea    Family history of adverse reaction to anesthesia    mom-delayed emergence   GERD (gastroesophageal reflux disease)    rare-no meds   IBS (irritable bowel syndrome)    Insomnia    Manic depression (HCC)    Migraine    Motion sickness    cars   Obesity    Past Surgical History:  Procedure Laterality Date   APPENDECTOMY     age 69   CARPAL TUNNEL RELEASE Right 04/23/2023   Procedure: CARPAL TUNNEL RELEASE;  Surgeon: Kennedy Bucker, MD;  Location: [facility];  Service: Orthopedics;  Laterality: Right;   CERVICAL DISC ARTHROPLASTY N/A 10/03/2023   Procedure: C5-6 ARTHROPLASTY;  Surgeon: Lovenia Kim, MD;  Location: [facility];  Service: Neurosurgery;  Laterality: N/A;   COLONOSCOPY WITH PROPOFOL N/A 03/15/2023   Procedure: COLONOSCOPY WITH PROPOFOL;  Surgeon: Midge Minium, MD;  Location: [facility];  Service: Endoscopy;  Laterality: N/A;   Patient Active Problem List   Diagnosis Date Noted   Cervical radiculopathy 10/03/2023   Cervical disc  disorder with radiculopathy 09/24/2023   Cervical disc disorder with radiculopathy of cervical region 08/30/2023   Cervicalgia 08/12/2023   Chronic bilateral low back pain with bilateral sciatica 08/12/2023   Spondylosis, cervical, with myelopathy 07/10/2023   Diarrhea 03/15/2023    PCP: Sandrea Hughs, NP   REFERRING PROVIDER: Lovenia Kim, MD   REFERRING DIAG:  M54.2 (ICD-10-CM) - Cervicalgia  M50.10 (ICD-10-CM) - Cervical disc disorder with radiculopathy of cervical region    THERAPY DIAG:  Neck pain  Cervicalgia  Muscle weakness (generalized)  Rationale for Evaluation and Treatment: Rehabilitation  ONSET DATE: 10/15/24  SUBJECTIVE:  SUBJECTIVE STATEMENT: Pt reports that she was diagnosed and recovering from Covid over the last 2 weeks.    Pain in the R arm is constant. Pins and needles, burning, heavy "like a tooth ache" .  Also continues to have pain in the LUE intermittently; mostly burning or tingling.   Reports that things have continued to be hard at home. Is attending 2-3 AA meetings per day. Is hoping that disability will be approved in the next couple months.   Hand dominance: Right  PERTINENT HISTORY:  Patient had C5/C5-6 arthroplasty on 10/03/2023.  Was recovering well but then started to have some similar discomfort in her hands and upper extremities and was referred to physical therapy to work on cervical mobility and to improve pain.  PAIN:  Are you having pain? 5/10  RUE achiness shoulder to hand; same on LUE but not as severe.   PRECAUTIONS: Cervical  RED FLAGS: None      WEIGHT BEARING RESTRICTIONS: No  FALLS:  Has patient fallen in last 6 months? No  LIVING ENVIRONMENT: Lives with: lives in a boarding home Lives in:  House/apartment Stairs: Yes: External: 15 steps; on right going up Has following equipment at home: None  OCCUPATION: Not working but would like to get into vocational rehabilitation. She wants to get into a job she can help other people  PLOF: Independent  PATIENT GOALS: Improve pain and hopefully get back to working   NEXT MD VISIT: saw MD yesterday   OBJECTIVE:  Note: Objective measures were completed at Evaluation unless otherwise noted.  DIAGNOSTIC FINDINGS:  IMPRESSION 11/14/23: Prior fixation at C5-6 level with intervertebral spacer without malalignment.  PATIENT SURVEYS:  FOTO 51  COGNITION: Overall cognitive status: Within functional limits for tasks assessed   POSTURE: rounded shoulders and forward head  PALPATION: Very Tender to palpation in R UT and R pec minor    CERVICAL ROM:   Active ROM A/PROM (deg) eval  Flexion WNL  Extension 22  Right lateral flexion 20  Left lateral flexion 20  Right rotation 55  Left rotation 50   (Blank rows = not tested)  UPPER EXTREMITY ROM:  Active ROM Right eval Left eval  Shoulder flexion Pain but WNL   Shoulder extension    Shoulder abduction Pain but WNL   Shoulder adduction    Shoulder extension    Shoulder internal rotation    Shoulder external rotation    Elbow flexion    Elbow extension    Wrist flexion    Wrist extension    Wrist ulnar deviation    Wrist radial deviation    Wrist pronation    Wrist supination     (Blank rows = not tested)  UPPER EXTREMITY MMT:  MMT Right eval Left eval  Shoulder flexion    Shoulder extension    Shoulder abduction    Shoulder adduction    Shoulder extension    Shoulder internal rotation    Shoulder external rotation    Middle trapezius    Lower trapezius    Elbow flexion    Elbow extension    Wrist flexion    Wrist extension    Wrist ulnar deviation    Wrist radial deviation    Wrist pronation    Wrist supination    Grip strength     (Blank rows =  not tested)   TREATMENT DATE: 12/20/23 Supine:                                                                                                                              -  heat applied to posterior neck in hooklying, legs supported -bilat elbow ROM 0-end range x15 bilat -isometric cervical retraction into pillow 10x3secH  -Hooklying cervical rotation A/ROM 1x10 bilat, pain free range  -cervical and scapular muslce release: most tender and tight Right UT and paraspinals  Seated:  Mid row AROM and RTB with PVC pipc x 5 each.  Seated shoulder extension 2x 10 Rhythmic Trunk and cervical lateral flexion within pain free range 2 x 45 sec  Scapula depression 2 x 5 with increased n/t into RUE.    Manual:  3 rounds of suboccipital release x 40 seconds each.   UT STM 3 x 45 sec bil  SCM TP release 2 x 30 sec bil  Cervical rotation R with STM to L cervical paraspinals x 6; repeated on the L side.  No tenderness noted in the pec minor on this day.   Attempted RUE GH mobilization and distraction with increased radicular s/s.  STM  to distal lats/and teres x 30 sec. Then increased paraesthesia.   Upon completion, pt reports decreased radicular  pain in the LUE and reduced intensity of paraesthesia in the RUE.  PATIENT EDUCATION:  Education details: POC Person educated: Patient Education method: Explanation Education comprehension: verbalized understanding   HOME EXERCISE PROGRAM: Access Code: [email or web address] URL: [email or web address] Date: 11/14/2023 Prepared by: Thresa Ross  Exercises - Seated Head Rotation  - 2 x daily - 7 x weekly - 1 sets - 10 reps - 3 sec hold - Seated Cervical Sidebending AROM  - 3 x daily - 7 x weekly - 1 sets - 10 reps - 5 sec hold - Supine Chin Tuck  - 1 x daily - 7 x weekly - 1 sets - 10 reps -  sec hold - Seated Scapular Retraction  - 3 x daily - 7 x weekly - 1 sets - 10 reps   ASSESSMENT:  CLINICAL IMPRESSION: Continued to work on pain  management, genlte ROM, and motor control training. Continue to encourage relaxation techniques and management of pain with postural exercises at home. Pt states that radicular pain in the hand in now constant in the R and increasing in the L pre treatment, but mildly improved upon completion. Pt will continue to benefit from skilled physical therapy intervention to address impairments, improve QOL, and attain therapy goals.    OBJECTIVE IMPAIRMENTS: decreased activity tolerance, decreased ROM, impaired UE functional use, postural dysfunction, and pain.   ACTIVITY LIMITATIONS: carrying, dressing, and reach over head  PARTICIPATION LIMITATIONS: cleaning, community activity, and occupation  PERSONAL FACTORS: Age, Time since onset of injury/illness/exacerbation, and 3+ comorbidities: Anxiety, Bipolar 1 disorder, depressed, Chronic LBP  are also affecting patient's functional outcome.   REHAB POTENTIAL: Fair secondary to chronicity of pain and influence of stress and anxiety on her levels of pain and stressors in her life influencing this.   CLINICAL DECISION MAKING: Evolving/moderate complexity  EVALUATION COMPLEXITY: Moderate   GOALS: Goals reviewed with patient? Yes  SHORT TERM GOALS: Target date: 12/12/2023    Patient will be independent in home exercise program to improve strength/mobility for better functional independence with ADLs. Baseline: No HEP currently  Goal status: INITIAL   LONG TERM GOALS: Target date: 01/09/2024   Patient will improve focus on therapeutic outcome survey score by 6 points or greater in order to indicate improved subjective level of functioning based on her recent surgical procedure Baseline: 51 Goal status: INITIAL  2.  Patient will decrease [place] scale 11 by 10 points or  greater in order to indicate improved subjective level of confidence in her ability to complete daily activities and general exercise without increase in pain. Baseline: 28 Goal  status: INITIAL  3.  Patient will improve cervical range of motion with sidebending by 15 degrees bilaterally in order to indicate improved cervical active range of motion and stiffness Baseline: See eval Goal status: INITIAL   PLAN:  PT FREQUENCY: 1-2x/week  PT DURATION: 8 weeks  PLANNED INTERVENTIONS: 97110-Therapeutic exercises, 97530- Therapeutic activity, 97112- Neuromuscular re-education, 97535- Self Care, 54098- Manual therapy, [id number]- Electrical stimulation (manual), 97035- Ultrasound, Balance training, Dry Needling, Joint mobilization, Spinal manipulation, and Moist heat  PLAN FOR NEXT SESSION:   Cervical ROM as tolerated, postural strength, median nerve glides, suboccipital release to glenohumeral distraction, monitor address any tight muscle areas   Golden Pop, PT 12/20/2023, 8:28 AM

## 2023-12-23 ENCOUNTER — Inpatient Hospital Stay
Admission: RE | Admit: 2023-12-23 | Discharge: 2023-12-23 | Disposition: A | Discharge status: Home / Self Care | Payer: Self-pay | Source: Ambulatory Visit | Attending: Primary Care

## 2023-12-23 ENCOUNTER — Inpatient Hospital Stay
Admission: RE | Admit: 2023-12-23 | Discharge: 2023-12-23 | Disposition: A | Discharge status: Home / Self Care | Payer: Self-pay | Source: Ambulatory Visit | Attending: Primary Care | Admitting: Primary Care

## 2023-12-23 ENCOUNTER — Other Ambulatory Visit: Payer: Self-pay | Admitting: *Deleted

## 2023-12-23 ENCOUNTER — Encounter: Payer: MEDICAID | Admitting: Physician Assistant

## 2023-12-23 DIAGNOSIS — Z1231 Encounter for screening mammogram for malignant neoplasm of breast: Secondary | ICD-10-CM

## 2023-12-24 ENCOUNTER — Ambulatory Visit: Payer: MEDICAID | Admitting: Physical Therapy

## 2023-12-24 ENCOUNTER — Other Ambulatory Visit: Payer: Self-pay

## 2023-12-24 DIAGNOSIS — M542 Cervicalgia: Secondary | ICD-10-CM

## 2023-12-24 DIAGNOSIS — R2689 Other abnormalities of gait and mobility: Secondary | ICD-10-CM

## 2023-12-24 DIAGNOSIS — M5010 Cervical disc disorder with radiculopathy, unspecified cervical region: Secondary | ICD-10-CM

## 2023-12-24 DIAGNOSIS — M6281 Muscle weakness (generalized): Secondary | ICD-10-CM

## 2023-12-24 DIAGNOSIS — R269 Unspecified abnormalities of gait and mobility: Secondary | ICD-10-CM

## 2023-12-24 NOTE — Therapy (Unsigned)
 OUTPATIENT PHYSICAL THERAPY TREATMENT   Patient Name: Stephanie Holland MRN: [id number] DOB:Aug 10, 1966, 58 y.o., female Today's Date: 12/24/2023  END OF SESSION:  PT End of Session - 12/24/23 1151     Visit Number 5    Number of Visits 16    Date for PT Re-Evaluation 01/09/24    Authorization Type [facility] Plan    Progress Note Due on Visit 10    PT Start Time 1150    PT Stop Time 1230    PT Time Calculation (min) 40 min    Activity Tolerance Patient tolerated treatment well;No increased pain;Patient limited by fatigue    Behavior During Therapy [facility] for tasks assessed/performed              Past Medical History:  Diagnosis Date   Alcohol abuse    sober since 06-2022   Anxiety    Bipolar 1 disorder, depressed (HCC)    C6 radiculopathy    Cervical spondylosis    Chronic low back pain with bilateral sciatica    Cocaine abuse in remission (HCC)    clean since 06-2022   Depression    Dyspnea    Family history of adverse reaction to anesthesia    mom-delayed emergence   GERD (gastroesophageal reflux disease)    rare-no meds   IBS (irritable bowel syndrome)    Insomnia    Manic depression (HCC)    Migraine    Motion sickness    cars   Obesity    Past Surgical History:  Procedure Laterality Date   APPENDECTOMY     age 21   CARPAL TUNNEL RELEASE Right 04/23/2023   Procedure: CARPAL TUNNEL RELEASE;  Surgeon: Kennedy Bucker, MD;  Location: [facility];  Service: Orthopedics;  Laterality: Right;   CERVICAL DISC ARTHROPLASTY N/A 10/03/2023   Procedure: C5-6 ARTHROPLASTY;  Surgeon: Lovenia Kim, MD;  Location: [facility];  Service: Neurosurgery;  Laterality: N/A;   COLONOSCOPY WITH PROPOFOL N/A 03/15/2023   Procedure: COLONOSCOPY WITH PROPOFOL;  Surgeon: Midge Minium, MD;  Location: [facility];  Service: Endoscopy;  Laterality: N/A;   Patient Active Problem List   Diagnosis Date Noted   Cervical radiculopathy 10/03/2023   Cervical disc  disorder with radiculopathy 09/24/2023   Cervical disc disorder with radiculopathy of cervical region 08/30/2023   Cervicalgia 08/12/2023   Chronic bilateral low back pain with bilateral sciatica 08/12/2023   Spondylosis, cervical, with myelopathy 07/10/2023   Diarrhea 03/15/2023    PCP: Sandrea Hughs, NP   REFERRING PROVIDER: Lovenia Kim, MD   REFERRING DIAG:  M54.2 (ICD-10-CM) - Cervicalgia  M50.10 (ICD-10-CM) - Cervical disc disorder with radiculopathy of cervical region    THERAPY DIAG:  Neck pain  Cervicalgia  Muscle weakness (generalized)  Other abnormalities of gait and mobility  Abnormality of gait and mobility  Rationale for Evaluation and Treatment: Rehabilitation  ONSET DATE: 10/15/24  SUBJECTIVE:  SUBJECTIVE STATEMENT: Pt reports that she has been in constant pain in the R shoulder since the last PT treatment. States that pain has been burning constantly, and throbbing in the anterior shoulder with static hold.   Hand dominance: Right  PERTINENT HISTORY:  Patient had C5/C5-6 arthroplasty on 10/03/2023.  Was recovering well but then started to have some similar discomfort in her hands and upper extremities and was referred to physical therapy to work on cervical mobility and to improve pain.  PAIN:  Are you having pain? 5/10  RUE achiness shoulder to hand; same on LUE but not as severe.   PRECAUTIONS: Cervical  RED FLAGS: None      WEIGHT BEARING RESTRICTIONS: No  FALLS:  Has patient fallen in last 6 months? No  LIVING ENVIRONMENT: Lives with: lives in a boarding home Lives in: House/apartment Stairs: Yes: External: 15 steps; on right going up Has following equipment at home: None  OCCUPATION: Not working but would like to get into vocational  rehabilitation. She wants to get into a job she can help other people  PLOF: Independent  PATIENT GOALS: Improve pain and hopefully get back to working   NEXT MD VISIT: saw MD yesterday   OBJECTIVE:  Note: Objective measures were completed at Evaluation unless otherwise noted.  DIAGNOSTIC FINDINGS:  IMPRESSION 11/14/23: Prior fixation at C5-6 level with intervertebral spacer without malalignment.  PATIENT SURVEYS:  FOTO 51  COGNITION: Overall cognitive status: Within functional limits for tasks assessed   POSTURE: rounded shoulders and forward head  PALPATION: Very Tender to palpation in R UT and R pec minor    CERVICAL ROM:   Active ROM A/PROM (deg) eval  Flexion WNL  Extension 22  Right lateral flexion 20  Left lateral flexion 20  Right rotation 55  Left rotation 50   (Blank rows = not tested)  UPPER EXTREMITY ROM:  Active ROM Right eval Left eval  Shoulder flexion Pain but WNL   Shoulder extension    Shoulder abduction Pain but WNL   Shoulder adduction    Shoulder extension    Shoulder internal rotation    Shoulder external rotation    Elbow flexion    Elbow extension    Wrist flexion    Wrist extension    Wrist ulnar deviation    Wrist radial deviation    Wrist pronation    Wrist supination     (Blank rows = not tested)  UPPER EXTREMITY MMT:  MMT Right eval Left eval  Shoulder flexion    Shoulder extension    Shoulder abduction    Shoulder adduction    Shoulder extension    Shoulder internal rotation    Shoulder external rotation    Middle trapezius    Lower trapezius    Elbow flexion    Elbow extension    Wrist flexion    Wrist extension    Wrist ulnar deviation    Wrist radial deviation    Wrist pronation    Wrist supination    Grip strength     (Blank rows = not tested)   TREATMENT DATE: 12/24/23 Supine:                                                                                                                               -  heat applied to posterior neck in hooklying, legs supported -bilat elbow ROM 0-end range x15 bilat -isometric cervical retraction into pillow 10x3secH  -Hooklying cervical rotation A/ROM 1x10 bilat, pain free range  -cervical and scapular muslce release: most tender and tight Right UT and paraspinals  Seated:  Mid row AROM and RTB with PVC pipc x 5 each.  Seated shoulder extension 2x 10 Rhythmic Trunk and cervical lateral flexion within pain free range 2 x 45 sec  Scapula depression 2 x 5 with increased n/t into RUE.    Manual:  3 rounds of suboccipital release x 40 seconds each.   UT STM 3 x 45 sec bil  SCM TP release 2 x 30 sec bil  Cervical rotation R with STM to L cervical paraspinals x 6; repeated on the L side.  No tenderness noted in the pec minor on this day.   Attempted RUE GH mobilization and distraction with increased radicular s/s.  STM  to distal lats/and teres x 30 sec. Then increased paraesthesia.   Upon completion, pt reports decreased radicular  pain in the LUE and reduced intensity of paraesthesia in the RUE.  PATIENT EDUCATION:  Education details: POC Person educated: Patient Education method: Explanation Education comprehension: verbalized understanding   HOME EXERCISE PROGRAM: Access Code: [email or web address] URL: [email or web address] Date: 11/14/2023 Prepared by: Thresa Ross  Exercises - Seated Head Rotation  - 2 x daily - 7 x weekly - 1 sets - 10 reps - 3 sec hold - Seated Cervical Sidebending AROM  - 3 x daily - 7 x weekly - 1 sets - 10 reps - 5 sec hold - Supine Chin Tuck  - 1 x daily - 7 x weekly - 1 sets - 10 reps -  sec hold - Seated Scapular Retraction  - 3 x daily - 7 x weekly - 1 sets - 10 reps   ASSESSMENT:  CLINICAL IMPRESSION: Continued to work on pain management, genlte ROM, and motor control training. Continue to encourage relaxation techniques and management of pain with postural exercises at home. Pt states that  radicular pain in the hand in now constant in the R and increasing in the L pre treatment, but mildly improved upon completion. Pt will continue to benefit from skilled physical therapy intervention to address impairments, improve QOL, and attain therapy goals.    OBJECTIVE IMPAIRMENTS: decreased activity tolerance, decreased ROM, impaired UE functional use, postural dysfunction, and pain.   ACTIVITY LIMITATIONS: carrying, dressing, and reach over head  PARTICIPATION LIMITATIONS: cleaning, community activity, and occupation  PERSONAL FACTORS: Age, Time since onset of injury/illness/exacerbation, and 3+ comorbidities: Anxiety, Bipolar 1 disorder, depressed, Chronic LBP  are also affecting patient's functional outcome.   REHAB POTENTIAL: Fair secondary to chronicity of pain and influence of stress and anxiety on her levels of pain and stressors in her life influencing this.   CLINICAL DECISION MAKING: Evolving/moderate complexity  EVALUATION COMPLEXITY: Moderate   GOALS: Goals reviewed with patient? Yes  SHORT TERM GOALS: Target date: 12/12/2023    Patient will be independent in home exercise program to improve strength/mobility for better functional independence with ADLs. Baseline: No HEP currently  Goal status: INITIAL   LONG TERM GOALS: Target date: 01/09/2024   Patient will improve focus on therapeutic outcome survey score by 6 points or greater in order to indicate improved subjective level of functioning based on her recent surgical procedure Baseline: 51 Goal status: INITIAL  2.  Patient will decrease [place] scale 11 by 10 points or  greater in order to indicate improved subjective level of confidence in her ability to complete daily activities and general exercise without increase in pain. Baseline: 28 Goal status: INITIAL  3.  Patient will improve cervical range of motion with sidebending by 15 degrees bilaterally in order to indicate improved cervical active range of  motion and stiffness Baseline: See eval Goal status: INITIAL   PLAN:  PT FREQUENCY: 1-2x/week  PT DURATION: 8 weeks  PLANNED INTERVENTIONS: 97110-Therapeutic exercises, 97530- Therapeutic activity, 97112- Neuromuscular re-education, 97535- Self Care, 16109- Manual therapy, [id number]- Electrical stimulation (manual), 97035- Ultrasound, Balance training, Dry Needling, Joint mobilization, Spinal manipulation, and Moist heat  PLAN FOR NEXT SESSION:   Cervical ROM as tolerated, postural strength, median nerve glides, suboccipital release to glenohumeral distraction, monitor address any tight muscle areas   Golden Pop, PT 12/24/2023, 11:51 AM

## 2023-12-25 ENCOUNTER — Ambulatory Visit (INDEPENDENT_AMBULATORY_CARE_PROVIDER_SITE_OTHER): Payer: MEDICAID | Admitting: Physician Assistant

## 2023-12-25 ENCOUNTER — Other Ambulatory Visit: Payer: Self-pay

## 2023-12-25 VITALS — BP 138/88 | Ht 66.0 in | Wt 219.0 lb

## 2023-12-25 DIAGNOSIS — Z09 Encounter for follow-up examination after completed treatment for conditions other than malignant neoplasm: Secondary | ICD-10-CM

## 2023-12-25 DIAGNOSIS — M25511 Pain in right shoulder: Secondary | ICD-10-CM

## 2023-12-25 DIAGNOSIS — M50122 Cervical disc disorder at C5-C6 level with radiculopathy: Secondary | ICD-10-CM

## 2023-12-25 MED ORDER — PREGABALIN 75 MG PO CAPS
75.0000 mg | ORAL_CAPSULE | Freq: Two times a day (BID) | ORAL | 2 refills | Status: AC
  Filled 2023-12-25: qty 60, 30d supply, fill #0

## 2023-12-25 MED ORDER — METHYLPREDNISOLONE 4 MG PO TBPK
ORAL_TABLET | ORAL | 0 refills | Status: AC
  Filled 2023-12-25: qty 21, 6d supply, fill #0

## 2023-12-25 NOTE — Progress Notes (Signed)
 REFERRING PHYSICIAN:  Sandrea Hughs, [address] [place],  [place] [address]  DOS: 10/03/23, C5-6 arthroplasty   Numbness/tingling in both arms, feel weak. New pain right shoulder pain x 2 weeks. Been doing PT. Not taking gabapentin or lyrica anymore. Not sure why has new pain. About a month.  Both arms hurt all the time. Pins and needles hurts anhwn driving. All 5 fingdrs on both hands.  Unable to use gabapentin or lyrica   HISTORY OF PRESENT ILLNESS: Stephanie Holland is 2.5 months s/p C5-6 arthroplasty.  Unfortunately patient was doing very well and is now in new pain since surgery.  She states she believes this is due to her recent work with physical therapy and is having new severe right shoulder pain over the last several weeks.  She states it hurts to lift up her shoulder and hurts when she touches it in a very specific position.  In addition, she also stopped taking Lyrica about 6 weeks ago due to having an issue taking it in her current living situation.  She is having a recurrence of paresthesias in bilateral arms into both hands and all 5 fingers.  No new weakness.  No new changes to gait.   PHYSICAL EXAMINATION:  NEUROLOGICAL:  General: In no acute distress.   Awake, alert, oriented to person, place, and time.  Pupils equal round and reactive to light.  Facial tone is symmetric.    Patient is emotional during visit.  Patient has pain to the anterior portion of her shoulder.  Also has pain with provocative maneuvers of her right shoulder.  Strength: Side Biceps Triceps Deltoid Interossei Grip Wrist Ext. Wrist Flex.  R 5 5 5 5 5 5 5   L 5 5 5 5 5 5 5    Incision c/d/I.       Assessment / Plan: Keary Enyla Lisbon is 2.5 months s/p C5-6 arthroplasty.  Unfortunately patient was doing very well and is now in new pain since surgery.  She states she believes this is due to her recent work with physical therapy and is having new severe right shoulder pain over the last several  weeks.  She states it hurts to lift up her shoulder and hurts when she touches it in a very specific position.  In addition, she also stopped taking Lyrica about 6 weeks ago due to having an issue taking it in her current living situation.  She is having a recurrence of paresthesias in bilateral arms into both hands and all 5 fingers.  No new weakness.  No new changes to gait. Patient has pain to the anterior portion of her shoulder.  Also has pain with provocative maneuvers of her right shoulder.  Pleasure to see patient in clinic today.  We did discuss how her pain seems to be more orthopedic in nature and focused on her right shoulder.  We will plan for x-rays today as well as a referral to orthopedic surgery.  In addition, we will get repeat x-rays of her cervical spine to ensure no changes to her hardware.  We also discussed although it is more difficult for her to take Lyrica and her current home situation, and that she would likely benefit from this quite a bit and it was likely helping her neuropathic pain more than she may have realize.  She is on board with taking this again.  In addition I have ordered a short course of a Medrol Dosepak.We discussed activity escalation and I  have advised the patient to lift up to 10 pounds until 6 weeks after surgery, then increase up to 25 pounds until 12 weeks after surgery.  After 12 weeks post-op, the patient advised to increase activity as tolerated. she will return to clinic in approximately 4 weeks.  She was encouraged to reach out for any questions or concerns she had in the meantime.  Will review imaging once complete.  Encouraged to reach out to [place] for continued pain or any changes that she may experience.    Advised to contact the office if any questions or concerns arise.   Joan Flores PA-C Dept of Neurosurgery

## 2023-12-26 ENCOUNTER — Other Ambulatory Visit: Payer: Self-pay | Admitting: Primary Care

## 2023-12-26 DIAGNOSIS — N63 Unspecified lump in unspecified breast: Secondary | ICD-10-CM

## 2023-12-27 ENCOUNTER — Ambulatory Visit: Payer: MEDICAID | Admitting: Physical Therapy

## 2023-12-30 ENCOUNTER — Ambulatory Visit
Admission: RE | Admit: 2023-12-30 | Discharge: 2023-12-30 | Disposition: A | Discharge status: Home / Self Care | Payer: MEDICAID | Source: Ambulatory Visit | Attending: Physician Assistant | Admitting: Physician Assistant

## 2023-12-30 ENCOUNTER — Ambulatory Visit
Admission: RE | Admit: 2023-12-30 | Discharge: 2023-12-30 | Disposition: A | Discharge status: Home / Self Care | Payer: MEDICAID | Attending: Physician Assistant | Admitting: Physician Assistant

## 2023-12-30 DIAGNOSIS — M25511 Pain in right shoulder: Secondary | ICD-10-CM | POA: Insufficient documentation

## 2023-12-30 DIAGNOSIS — M5010 Cervical disc disorder with radiculopathy, unspecified cervical region: Secondary | ICD-10-CM

## 2024-01-01 ENCOUNTER — Ambulatory Visit: Payer: MEDICAID | Attending: Neurosurgery | Admitting: Physical Therapy

## 2024-01-03 ENCOUNTER — Telehealth: Payer: Self-pay | Admitting: Physical Therapy

## 2024-01-03 ENCOUNTER — Ambulatory Visit: Payer: MEDICAID | Admitting: Physical Therapy

## 2024-01-03 NOTE — Telephone Encounter (Signed)
 Pt missed scheduled PT treatment without notifying clinic. Reached out to pt via telephone. No Answer. Left voicemail. Requested call back to confirm continuation of PT services.   Grier Rocher PT, DPT  Physical Therapist - [place]  [facility]  12:31 PM 01/03/24

## 2024-01-07 ENCOUNTER — Other Ambulatory Visit: Payer: Self-pay

## 2024-01-08 ENCOUNTER — Encounter: Payer: MEDICAID | Admitting: Physical Therapy

## 2024-01-10 ENCOUNTER — Encounter: Payer: MEDICAID | Admitting: Physical Therapy

## 2024-01-15 ENCOUNTER — Telehealth: Payer: Self-pay | Admitting: Physical Therapy

## 2024-01-15 ENCOUNTER — Ambulatory Visit: Payer: MEDICAID | Admitting: Physical Therapy

## 2024-01-15 NOTE — Telephone Encounter (Signed)
 Pt missed scheduled PT treatment. Called pt to notify of missed appointment. This is the second no show without notifying office. Will remove future scheduled appointments. Asked pt to call and re-schedule if she wishes to continuing PT treatment. [phone].   Grier Rocher PT, DPT  Physical Therapist - [facility]  12:10 PM 01/15/24

## 2024-01-16 ENCOUNTER — Other Ambulatory Visit: Payer: Self-pay

## 2024-01-17 ENCOUNTER — Ambulatory Visit: Payer: MEDICAID | Admitting: Physical Therapy

## 2024-01-17 ENCOUNTER — Other Ambulatory Visit: Payer: Self-pay

## 2024-01-22 ENCOUNTER — Ambulatory Visit: Payer: MEDICAID | Admitting: Physical Therapy

## 2024-01-24 ENCOUNTER — Encounter: Payer: MEDICAID | Admitting: Physical Therapy

## 2024-01-28 ENCOUNTER — Ambulatory Visit
Admission: RE | Admit: 2024-01-28 | Discharge: 2024-01-28 | Disposition: A | Discharge status: Home / Self Care | Payer: MEDICAID | Source: Ambulatory Visit | Attending: Primary Care | Admitting: Primary Care

## 2024-01-28 DIAGNOSIS — N63 Unspecified lump in unspecified breast: Secondary | ICD-10-CM | POA: Insufficient documentation

## 2024-01-29 ENCOUNTER — Encounter: Payer: MEDICAID | Admitting: Physical Therapy

## 2024-01-31 ENCOUNTER — Encounter: Payer: MEDICAID | Admitting: Physical Therapy

## 2024-02-04 ENCOUNTER — Other Ambulatory Visit: Payer: Self-pay

## 2024-02-04 MED ORDER — HYDROXYZINE HCL 25 MG PO TABS
25.0000 mg | ORAL_TABLET | ORAL | 1 refills | Status: DC
  Filled 2024-02-04: qty 90, 30d supply, fill #0

## 2024-02-04 MED ORDER — TRAZODONE HCL 100 MG PO TABS
150.0000 mg | ORAL_TABLET | Freq: Every evening | ORAL | 1 refills | Status: AC | PRN
  Filled 2024-02-04 – 2024-02-10 (×3): qty 45, 30d supply, fill #0
  Filled 2024-03-04: qty 45, 30d supply, fill #1

## 2024-02-04 MED ORDER — SERTRALINE HCL 100 MG PO TABS
200.0000 mg | ORAL_TABLET | Freq: Every day | ORAL | 1 refills | Status: AC
  Filled 2024-02-04 – 2024-02-17 (×2): qty 60, 30d supply, fill #0
  Filled 2024-03-20: qty 60, 30d supply, fill #1

## 2024-02-04 MED ORDER — ARIPIPRAZOLE 5 MG PO TABS
5.0000 mg | ORAL_TABLET | Freq: Every day | ORAL | 1 refills | Status: DC
  Filled 2024-02-04: qty 30, 30d supply, fill #0

## 2024-02-05 ENCOUNTER — Encounter: Payer: MEDICAID | Admitting: Physical Therapy

## 2024-02-07 ENCOUNTER — Other Ambulatory Visit: Payer: Self-pay

## 2024-02-07 ENCOUNTER — Encounter: Payer: MEDICAID | Admitting: Physical Therapy

## 2024-02-10 ENCOUNTER — Other Ambulatory Visit: Payer: Self-pay

## 2024-02-17 ENCOUNTER — Other Ambulatory Visit (HOSPITAL_COMMUNITY): Payer: Self-pay

## 2024-02-17 ENCOUNTER — Other Ambulatory Visit: Payer: Self-pay

## 2024-02-19 ENCOUNTER — Other Ambulatory Visit: Payer: Self-pay

## 2024-03-04 ENCOUNTER — Other Ambulatory Visit: Payer: Self-pay

## 2024-03-05 ENCOUNTER — Other Ambulatory Visit: Payer: Self-pay

## 2024-03-05 MED ORDER — TRAZODONE HCL 100 MG PO TABS
150.0000 mg | ORAL_TABLET | Freq: Every evening | ORAL | 1 refills | Status: AC | PRN
  Filled 2024-03-05 – 2024-03-27 (×3): qty 45, 30d supply, fill #0
  Filled 2024-04-20: qty 45, 30d supply, fill #1

## 2024-03-05 MED ORDER — ARIPIPRAZOLE 5 MG PO TABS
5.0000 mg | ORAL_TABLET | Freq: Every day | ORAL | 1 refills | Status: DC
  Filled 2024-03-05: qty 30, 30d supply, fill #0

## 2024-03-05 MED ORDER — SERTRALINE HCL 100 MG PO TABS
200.0000 mg | ORAL_TABLET | Freq: Every day | ORAL | 1 refills | Status: AC
  Filled 2024-03-05 – 2024-04-20 (×2): qty 60, 30d supply, fill #0

## 2024-03-05 MED ORDER — HYDROXYZINE HCL 25 MG PO TABS
25.0000 mg | ORAL_TABLET | Freq: Three times a day (TID) | ORAL | 1 refills | Status: DC
  Filled 2024-03-05: qty 90, 30d supply, fill #0

## 2024-03-12 ENCOUNTER — Other Ambulatory Visit: Payer: Self-pay

## 2024-03-16 ENCOUNTER — Other Ambulatory Visit: Payer: Self-pay

## 2024-03-20 ENCOUNTER — Other Ambulatory Visit: Payer: Self-pay

## 2024-03-26 ENCOUNTER — Other Ambulatory Visit: Payer: Self-pay

## 2024-03-27 ENCOUNTER — Other Ambulatory Visit: Payer: Self-pay

## 2024-04-13 NOTE — Progress Notes (Deleted)
 New Patient Evaluation and Consultation  Referring Provider: Center, Frederik Jansky* PCP: Ardia Kraft, NP Date of Service: 04/14/2024  SUBJECTIVE Chief Complaint: No chief complaint on file.  History of Present Illness: Stephanie Holland is a 58 y.o. White or Caucasian female seen in consultation at the request of Stephenie [facility] for evaluation of urinary incontinence.    Underwent PT for back pain  ***Review of records significant for: ***History of alcohol and cocaine abuse, migraine, low back pain with bilateral sciatica  Urinary Symptoms: {urine leakage?:24754} Leaks *** time(s) per {days/wks/mos/yrs:310907}.  Pad use: {NUMBERS 1-10:18281} {pad option:24752} per day.   Patient {ACTION; IS/IS ZOX:09604540} bothered by UI symptoms.  Day time voids ***.  Nocturia: *** times per night to void with insomnia Voiding dysfunction:  {empties:24755} bladder well.  Patient {DOES NOT does:27190::does not} use a catheter to empty bladder.  When urinating, patient feels {urine symptoms:24756} Drinks: *** per day  UTIs: {NUMBERS 1-10:18281} UTI's in the last year.   {ACTIONS;DENIES/REPORTS:21021675::Denies} history of {urologic concerns:24757} No results found for the last 90 days.   Pelvic Organ Prolapse Symptoms:                  Patient {denies/ admits to:24761} a feeling of a bulge the vaginal area. It has been present for {NUMBER 1-10:22536} {days/wks/mos/yrs:310907}.  Patient {denies/ admits to:24761} seeing a bulge.  This bulge {ACTION; IS/IS JWJ:19147829} bothersome.  Bowel Symptom: Bowel movements: *** time(s) per {Time; day/week/month:13537} with history of IBS Stool consistency: {stool consistency:24758} Straining: {yes/no:19897}.  Splinting: {yes/no:19897}.  Incomplete evacuation: {yes/no:19897}.  Patient {denies/ admits to:24761} accidental bowel leakage / fecal incontinence  Occurs: *** time(s) per {Time; day/week/month:13537}  Consistency  with leakage: {stool consistency:24758} Bowel regimen: {bowel regimen:24759} Last colonoscopy: Date ***, Results *** [facility] Colonoscopy          Upcoming     Colonoscopy (Every 10 Years) Next due on 03/14/2033    03/15/2023  COLONOSCOPY   Only the first 1 history entries have been loaded, but more history exists.                Sexual Function Sexually active: {yes/no:19897}.  Sexual orientation: {Sexual Orientation:[phone]} Pain with sex: {pain with sex:24762}  Pelvic Pain {denies/ admits to:24761} pelvic pain Location: *** Pain occurs: *** Prior pain treatment: *** Improved by: *** Worsened by: ***   Past Medical History:  Past Medical History:  Diagnosis Date   Alcohol abuse    sober since 06-2022   Anxiety    Bipolar 1 disorder, depressed (HCC)    C6 radiculopathy    Cervical spondylosis    Chronic low back pain with bilateral sciatica    Cocaine abuse in remission (HCC)    clean since 06-2022   Depression    Dyspnea    Family history of adverse reaction to anesthesia    mom-delayed emergence   GERD (gastroesophageal reflux disease)    rare-no meds   IBS (irritable bowel syndrome)    Insomnia    Manic depression (HCC)    Migraine    Motion sickness    cars   Obesity      Past Surgical History:   Past Surgical History:  Procedure Laterality Date   APPENDECTOMY     age 60   CARPAL TUNNEL RELEASE Right 04/23/2023   Procedure: CARPAL TUNNEL RELEASE;  Surgeon: Molli Angelucci, MD;  Location: [facility];  Service: Orthopedics;  Laterality: Right;   CERVICAL DISC ARTHROPLASTY N/A 10/03/2023  Procedure: C5-6 ARTHROPLASTY;  Surgeon: Carroll Clamp, MD;  Location: [facility];  Service: Neurosurgery;  Laterality: N/A;   COLONOSCOPY WITH PROPOFOL  N/A 03/15/2023   Procedure: COLONOSCOPY WITH PROPOFOL ;  Surgeon: Marnee Sink, MD;  Location: [facility];  Service: Endoscopy;  Laterality: N/A;     Past OB/GYN History: OB History  No  obstetric history on file.    Vaginal deliveries: ***,  Forceps/ Vacuum deliveries: ***, Cesarean section: *** Menopausal: {menopausal:24763} Contraception: Mirena IUD in place since 02/07/16, declines removal ***. Last pap smear was ***.  Any history of abnormal pap smears: {yes/no:19897}. No results found for: DIAGPAP, HPVHIGH, ADEQPAP  Medications: Patient has a current medication list which includes the following prescription(s): aripiprazole , aripiprazole , hydroxyzine , hydroxyzine , pregabalin , sertraline , sertraline , sertraline , trazodone , trazodone , trazodone , and [DISCONTINUED] gabapentin .   Allergies: Patient is allergic to lactose intolerance (gi) and hydrocodone .   Social History:  Social History   Tobacco Use   Smoking status: Never   Smokeless tobacco: Never  Vaping Use   Vaping status: Never Used  Substance Use Topics   Alcohol use: Not Currently    Comment: quit 07/19/2022   Drug use: Not Currently    Types: Cocaine, Methamphetamines, Crack cocaine    Comment: quit 07/19/2022    Relationship status: {relationship status:24764} Patient lives with ***.   Patient {ACTION; IS/IS ZDG:64403474} employed ***. Regular exercise: {Yes/No:304960894} History of abuse: {Yes/No:304960894}  Family History:   Family History  Problem Relation Age of Onset   Heart disease Mother    Heart disease Father    Breast cancer Other      Review of Systems: ROS   OBJECTIVE Physical Exam: There were no vitals filed for this visit.  OBGyn Exam  POP-Q:   POP-Q                                               Aa                                               Ba                                                 C                                                Gh                                               Pb                                               tvl  Ap                                               Bp                                                  D      Rectal Exam:  Normal sphincter tone, {rectocele:24766} distal rectocele, enterocoele {DESC; PRESENT/NOT PRESENT:21021351}, no rectal masses, {sign of:24767} dyssynergia when asking the patient to bear down.  Post-Void Residual (PVR) by Bladder Scan: In order to evaluate bladder emptying, we discussed obtaining a postvoid residual and patient agreed to this procedure.  Procedure: The ultrasound unit was placed on the patient's abdomen in the suprapubic region after the patient had voided.      Laboratory Results: Lab Results  Component Value Date   BILIRUBINUR NEGATIVE 01/19/2016   PROTEINUR NEGATIVE 01/19/2016   UROBILINOGEN 0.2 06/05/2013   LEUKOCYTESUR NEGATIVE 01/19/2016    Lab Results  Component Value Date   CREATININE 0.79 04/08/2023   CREATININE 0.81 01/19/2016   CREATININE 0.90 06/05/2013  Cr 0.95 on 12/02/23  No results found for: HGBA1C  Lab Results  Component Value Date   HGB 13.0 01/19/2016     ASSESSMENT AND PLAN Ms. Zunker is a 58 y.o. with: No diagnosis found.  There are no diagnoses linked to this encounter.   Darlene Ehlers, MD

## 2024-04-14 ENCOUNTER — Ambulatory Visit: Payer: MEDICAID | Admitting: Obstetrics

## 2024-04-15 ENCOUNTER — Ambulatory Visit: Payer: MEDICAID | Admitting: Physician Assistant

## 2024-04-20 ENCOUNTER — Other Ambulatory Visit: Payer: Self-pay

## 2024-05-05 ENCOUNTER — Other Ambulatory Visit: Payer: Self-pay

## 2024-05-05 MED ORDER — ARIPIPRAZOLE 5 MG PO TABS
5.0000 mg | ORAL_TABLET | Freq: Every day | ORAL | 2 refills | Status: DC
  Filled 2024-05-05: qty 30, 30d supply, fill #0

## 2024-05-05 MED ORDER — SERTRALINE HCL 100 MG PO TABS
200.0000 mg | ORAL_TABLET | Freq: Every day | ORAL | 2 refills | Status: AC
  Filled 2024-05-05 – 2024-06-22 (×2): qty 60, 30d supply, fill #0
  Filled 2024-07-20: qty 60, 30d supply, fill #1

## 2024-05-05 MED ORDER — HYDROXYZINE HCL 25 MG PO TABS
ORAL_TABLET | ORAL | 2 refills | Status: DC
  Filled 2024-05-05: qty 90, 30d supply, fill #0

## 2024-05-05 MED ORDER — TRAZODONE HCL 100 MG PO TABS
150.0000 mg | ORAL_TABLET | Freq: Every evening | ORAL | 2 refills | Status: AC | PRN
  Filled 2024-05-05 – 2024-05-14 (×2): qty 45, 30d supply, fill #0
  Filled 2024-06-03 – 2024-06-08 (×2): qty 45, 30d supply, fill #1
  Filled 2024-09-07: qty 45, 30d supply, fill #2

## 2024-05-06 ENCOUNTER — Other Ambulatory Visit: Payer: Self-pay

## 2024-05-14 ENCOUNTER — Other Ambulatory Visit: Payer: Self-pay

## 2024-06-03 ENCOUNTER — Other Ambulatory Visit: Payer: Self-pay

## 2024-06-08 ENCOUNTER — Other Ambulatory Visit: Payer: Self-pay

## 2024-06-09 ENCOUNTER — Other Ambulatory Visit: Payer: Self-pay

## 2024-06-22 ENCOUNTER — Other Ambulatory Visit: Payer: Self-pay

## 2024-07-20 ENCOUNTER — Other Ambulatory Visit: Payer: Self-pay

## 2024-08-25 ENCOUNTER — Other Ambulatory Visit: Payer: Self-pay

## 2024-09-07 ENCOUNTER — Other Ambulatory Visit: Payer: Self-pay

## 2024-09-09 ENCOUNTER — Encounter: Payer: Self-pay | Admitting: Sleep Medicine
